# Patient Record
Sex: Male | Born: 1970 | Race: White | Hispanic: No | Marital: Married | Smoking: Never smoker
Health system: Southern US, Community
[De-identification: ages and names within clinical notes are randomized; demographics above are authoritative.]

---

## 2006-10-08 ENCOUNTER — Ambulatory Visit: Payer: Self-pay | Admitting: Internal Medicine

## 2006-10-17 ENCOUNTER — Ambulatory Visit: Payer: Self-pay | Admitting: Internal Medicine

## 2006-10-17 LAB — CONVERTED CEMR LAB
AST: 24 units/L (ref 0–37)
Bacteria, UA: NEGATIVE
Basophils Absolute: 0 10*3/uL (ref 0.0–0.1)
Bilirubin Urine: NEGATIVE
Bilirubin, Direct: 0.2 mg/dL (ref 0.0–0.3)
Chloride: 105 meq/L (ref 96–112)
Creatinine, Ser: 1.1 mg/dL (ref 0.4–1.5)
Direct LDL: 110.1 mg/dL
Eosinophils Absolute: 0.3 10*3/uL (ref 0.0–0.6)
GFR calc non Af Amer: 81 mL/min
Glucose, Bld: 102 mg/dL — ABNORMAL HIGH (ref 70–99)
HDL: 26.8 mg/dL — ABNORMAL LOW (ref >39.0)
Leukocytes, UA: NEGATIVE
Monocytes Absolute: 0.6 10*3/uL (ref 0.2–0.7)
Neutro Abs: 2.7 10*3/uL (ref 1.4–7.7)
Neutrophils Relative %: 49.4 % (ref 43.0–77.0)
Nitrite: NEGATIVE
Platelets: 264 10*3/uL (ref 150–400)
Sodium: 141 meq/L (ref 135–145)
Total Bilirubin: 1 mg/dL (ref 0.3–1.2)
Total CHOL/HDL Ratio: 6.9
Total Protein, Urine: NEGATIVE mg/dL
Triglycerides: 256 mg/dL (ref 0–149)
Urine Glucose: NEGATIVE mg/dL
Urobilinogen, UA: 0.2 (ref 0.0–1.0)
VLDL: 51 mg/dL — ABNORMAL HIGH (ref 0–40)
WBC: 5.6 10*3/uL (ref 4.5–10.5)
pH: 5.5 (ref 5.0–8.0)

## 2010-07-18 ENCOUNTER — Encounter: Payer: Self-pay | Admitting: Internal Medicine

## 2010-07-18 ENCOUNTER — Ambulatory Visit (INDEPENDENT_AMBULATORY_CARE_PROVIDER_SITE_OTHER): Payer: BC Managed Care – PPO | Admitting: Internal Medicine

## 2010-07-18 DIAGNOSIS — R110 Nausea: Secondary | ICD-10-CM | POA: Insufficient documentation

## 2010-07-18 DIAGNOSIS — Z23 Encounter for immunization: Secondary | ICD-10-CM

## 2010-07-20 ENCOUNTER — Encounter: Payer: Self-pay | Admitting: Internal Medicine

## 2010-07-20 LAB — CONVERTED CEMR LAB
ALT: 54 units/L — ABNORMAL HIGH (ref 0–53)
Alkaline Phosphatase: 49 units/L (ref 39–117)
BUN: 14 mg/dL (ref 6–23)
Cholesterol: 127 mg/dL (ref 0–200)
Creatinine, Ser: 1.26 mg/dL (ref 0.40–1.50)
Indirect Bilirubin: 0.5 mg/dL (ref 0.0–0.9)
Total Protein: 7 g/dL (ref 6.0–8.3)
Triglycerides: 99 mg/dL (ref <150)

## 2010-07-21 ENCOUNTER — Encounter: Payer: Self-pay | Admitting: Internal Medicine

## 2010-07-27 NOTE — Letter (Addendum)
   [place] [facility] [address] [place], [place]  [address]  [place] Phone: [phone]      July 21, 2010   Paul Blackwell [address] [place], [place] [address]  RE:  LAB RESULTS  Dear  [name],  The following is an interpretation of your most recent lab tests.  Please take note of any instructions provided or changes to medications that have resulted from your lab work.  ELECTROLYTES:  Good - no changes needed  KIDNEY FUNCTION TESTS:  Good - no changes needed  LIVER FUNCTION TESTS:  Abnormal - schedule a follow-up appointment  LIPID PANEL:  Fair - review at your next visit Triglyceride: 99   Cholesterol: 127   LDL: 81   HDL: 26   Chol/HDL%:  4.9 Ratio   One of your liver enzymes (ALT) is mildly elevated. This is usually due to taking over-the-counter anti-inflammatories or herbal supplements.   I suggest you return in 3 months for repeat blood test.  In addition, your good cholesterol HDL is too low.  Please try to avoid trans-fats and  exercise on regular basis.   Occasional intake of red wine can also help increase HDL.          Sincerely Yours,    Dr. Thomos Lemons  Appended Document:  mailed

## 2010-08-08 NOTE — Assessment & Plan Note (Signed)
Summary: to re est/bcbs/nausea/ss   Vital Signs:  Patient profile:   40 year old male Height:      70.5 inches Weight:      189 pounds BMI:     26.83 O2 Sat:      98 % on Room air Temp:     98.3 degrees F oral Pulse rate:   70 / minute BP sitting:   110 / 64  (right arm) Cuff size:   large  Vitals Entered By: Glendell Docker CMA (July 18, 2010 1:26 PM)  O2 Flow:  Room air CC: Re-establish care Is Patient Diabetic? No Pain Assessment Patient in pain? no      Comments mild nausea for the past week, with light colored stools, evaluation of midback pain   Primary Care Provider:  Dondra Spry DO  CC:  Re-establish care.  History of Present Illness: 40 y/o male to establish c/o of nausea last week scale of 1-5, he describes 3 didn't stop him from eating stools became light in color --  "peanut butter color", and loose  mother has hx of gallbladder dz  no pain  no unusual food intake,  no correlation with what he ate  occ motrin no regular NSAID use  he takes fish oil and Magnesium supplement,  herbal supplement "zyflomend",  MVI started supplements 1 yr ago  wife is feeling normal  no sick contacts   Preventive Screening-Counseling & Management  Alcohol-Tobacco     Alcohol drinks/day: 0     Smoking Status: never  Caffeine-Diet-Exercise     Caffeine use/day: 1 beverage daily     Does Patient Exercise: yes     Times/week: 4      Drug Use:  no.    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  2008  Past Surgical History: Denies surgical history  Family History: lung cancer grandfather parents healthy  no colon ca no prosate ca mat uncle SCC   Social History: Occupation:  Art gallery manager for [employer] Married no children Never Smoked Alcohol use-no Drug use-no Smoking Status:  never Caffeine use/day:  1 beverage daily Does  Patient Exercise:  yes Drug Use:  no  Review of Systems  The patient denies fever, weight loss, chest pain, dyspnea on exertion, abdominal pain, melena, hematochezia, severe indigestion/heartburn, and depression.    Physical Exam  General:  alert, well-developed, and well-nourished.   Head:  normocephalic and atraumatic.   Ears:  R ear normal and L ear normal.   Mouth:  pharynx pink and moist.   Neck:  supple and no masses.   Lungs:  normal respiratory effort, normal breath sounds, no crackles, and no wheezes.   Heart:  normal rate, regular rhythm, no murmur, and no gallop.   Abdomen:  soft, non-tender, normal bowel sounds, no masses, no hepatomegaly, and no splenomegaly.   Extremities:  No lower extremity edema  Neurologic:  cranial nerves II-XII intact and gait normal.     Impression & Recommendations:  Problem # 1:  NAUSEA (ICD-787.02) I suspect pt recovering from transient gastroenteritis check LFTs if persistent symptoms, consider abd u/s  Orders: T-Basic Metabolic Panel [phone]) T-Hepatic Function [phone])  Other Orders: T-Lipid Profile [phone]) Tdap => 14yrs IM (63875) Admin 1st Vaccine (64332) Future Orders: T-Hepatic Function [phone]) ... 10/02/2010  Patient Instructions: 1)  Please schedule a follow-up appointment as needed. 2)  Please schedule a follow-up appointment in 1 year. 3)  Call our office if your symptoms do not  improve or gets worse.   Orders Added: 1)  T-Basic Metabolic Panel [[id number]] 2)  T-Hepatic Function [[id number]] 3)  T-Lipid Profile [[id number]] 4)  Tdap => 73yrs IM [90715] 5)  Admin 1st Vaccine [90471] 6)  T-Hepatic Function [[id number]] 7)  New Patient Level III [78295]   Immunization History:  Influenza Immunization History:    Influenza:  historical (03/07/2010)  Immunizations Administered:  Tetanus Vaccine:    Vaccine Type: Tdap    Site: left deltoid    Mfr: GlaxoSmithKline    Dose: 0.5  ml    Route: IM    Given by: Glendell Docker CMA    Exp. Date: 03/09/2012    Lot #: [id number]    VIS given: 04/07/08 version given July 18, 2010.   Immunization History:  Influenza Immunization History:    Influenza:  Historical (03/07/2010)  Immunizations Administered:  Tetanus Vaccine:    Vaccine Type: Tdap    Site: left deltoid    Mfr: GlaxoSmithKline    Dose: 0.5 ml    Route: IM    Given by: Glendell Docker CMA    Exp. Date: 03/09/2012    Lot #: [id number]    VIS given: 04/07/08 version given July 18, 2010.  Current Allergies (reviewed today): No known allergies

## 2010-10-03 NOTE — Assessment & Plan Note (Signed)
[facility]                           PRIMARY CARE OFFICE NOTE   NAME:Paul, Blackwell                         MRN:          [id number]  DATE:10/08/2006                            DOB:          07-31-1970    CHIEF COMPLAINT:  New patient to practice/chronic low back/thoracic  pain.   HISTORY OF PRESENT ILLNESS:  The patient is a 40 year old white male,  here to establish primary care.  He complains of chronic low back  pain/upper thoracic pain for the last 10 years that is intermittent.  He  has been to a chiropractor in the past, with minimal relief.  He  describes an aching type sensation that is exacerbation with extension  of his thoracolumbar spine.  No radiation to his legs.  No lower  extremity weakness.  He actually has some improvement when he engages in  physical activity.  [place] and [place] several  horses on a farm.  He notes physical labor occasionally improves the  discomfort.   He has not had any imaging studies.  There is no family history of back  problems.  He reports intermittent radiation of pain toward his sacral  area bilaterally.   CURRENT MEDICATIONS:  None.  Occasional OTC Motrin/ibuprofen.   ALLERGIES:  No known drug allergies.   SOCIAL HISTORY:  The patient is married for the last seven years.  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.   FAMILY HISTORY:  Mother and father are both age 60, healthy.  Emelia Loron is known to have lung cancer.   HABITS:  No alcohol, no tobacco, no recreational drug use.   REVIEW OF SYSTEMS:  Occasional hay fever/allergies.  No chest pain,  shortness of breath.  No heartburn, nausea, vomiting, constipation or  diarrhea.  No unusual rash.  No weight loss.  All other systems  negative.   PHYSICAL EXAMINATION:  VITAL SIGNS:  Weight 196.6 pounds, temperature  97.4 degrees, pulse 58, blood pressure 127/81 in the left arm in the  seated  position.  GENERAL:  The patient is a pleasant, well-developed and well-nourished  40 year old white male, in no apparent distress.  HEENT:  Normocephalic and atraumatic.  Pupils equal, reactive to light  bilaterally.  Extraocular motility intact.  The patient was anicteric.  Conjunctivae within normal limits.  Auditory canals and tympanic  membranes were clear bilaterally.  Hearing grossly normal.  NECK:  Supple, no adenopathy, carotid bruits or thyromegaly.  CHEST:  Normal respiratory effort.  Chest clear to auscultation  bilaterally.  No rales, rhonchi or wheezing.  CARDIOVASCULAR:  A regular rate and rhythm.  No significant murmurs,  rubs or gallops appreciated.  ABDOMEN:  Soft, nontender.  Positive bowel sounds.  No organomegaly.  MUSCULOSKELETAL:  The patient had 5/5 muscle strength throughout.  He  had a normal range of motion to his lumbar spine.  No pain with leg  raise bilaterally.  Reflexes were 0-1 in the upper extremity and +2 to 3  patella and +2 Achilles.  The patient was able to toe and  heel walk  without any difficulty.   IMPRESSION/RECOMMENDATIONS:  1. Chronic intermittent low back, otherwise healthy 40 year old white      male.  2. Health maintenance.   RECOMMENDATIONS:  1. The patient has uncomplicated low back pain.  I doubt that he has      any sacroiliac disease.  I doubt that he has ankylosing spondylitis      but will obtain an x-ray of his thoracolumbar spine.  I recommended      periodic over-the-counter analgesics and provided educational      material on low back exercises.  2. For routine health maintenance he will obtain screening labs,      including blood sugar and cholesterol, and arrange followup within      two to four weeks.     Barbette Hair. Artist Pais, DO  Electronically Signed    RDY/MedQ  DD: 10/09/2006  DT: 10/09/2006  Job #: [id number]

## 2013-01-14 ENCOUNTER — Ambulatory Visit (INDEPENDENT_AMBULATORY_CARE_PROVIDER_SITE_OTHER): Payer: BC Managed Care – PPO | Admitting: Psychology

## 2013-01-14 DIAGNOSIS — F411 Generalized anxiety disorder: Secondary | ICD-10-CM

## 2013-01-29 ENCOUNTER — Ambulatory Visit (HOSPITAL_COMMUNITY)
Admission: RE | Admit: 2013-01-29 | Discharge: 2013-01-29 | Disposition: A | Discharge status: Home / Self Care | Payer: BC Managed Care – PPO | Source: Ambulatory Visit | Attending: Chiropractic Medicine | Admitting: Chiropractic Medicine

## 2013-01-29 ENCOUNTER — Other Ambulatory Visit (HOSPITAL_COMMUNITY): Payer: Self-pay | Admitting: Chiropractic Medicine

## 2013-01-29 DIAGNOSIS — M549 Dorsalgia, unspecified: Secondary | ICD-10-CM

## 2013-01-29 DIAGNOSIS — M4130 Thoracogenic scoliosis, site unspecified: Secondary | ICD-10-CM | POA: Insufficient documentation

## 2013-02-02 ENCOUNTER — Ambulatory Visit (INDEPENDENT_AMBULATORY_CARE_PROVIDER_SITE_OTHER): Payer: BC Managed Care – PPO | Admitting: Psychology

## 2013-02-02 DIAGNOSIS — F411 Generalized anxiety disorder: Secondary | ICD-10-CM

## 2013-02-19 ENCOUNTER — Ambulatory Visit: Payer: BC Managed Care – PPO | Admitting: Psychology

## 2013-02-26 ENCOUNTER — Ambulatory Visit (INDEPENDENT_AMBULATORY_CARE_PROVIDER_SITE_OTHER): Payer: BC Managed Care – PPO | Admitting: Psychology

## 2013-02-26 DIAGNOSIS — F411 Generalized anxiety disorder: Secondary | ICD-10-CM

## 2013-03-05 ENCOUNTER — Ambulatory Visit: Payer: BC Managed Care – PPO | Admitting: Psychology

## 2013-03-11 ENCOUNTER — Ambulatory Visit: Payer: BC Managed Care – PPO | Admitting: Psychology

## 2013-03-25 ENCOUNTER — Ambulatory Visit: Payer: BC Managed Care – PPO | Admitting: Psychology

## 2013-04-01 ENCOUNTER — Ambulatory Visit (INDEPENDENT_AMBULATORY_CARE_PROVIDER_SITE_OTHER): Payer: BC Managed Care – PPO | Admitting: Psychology

## 2013-04-01 DIAGNOSIS — F411 Generalized anxiety disorder: Secondary | ICD-10-CM

## 2013-04-08 ENCOUNTER — Ambulatory Visit: Payer: BC Managed Care – PPO | Admitting: Psychology

## 2013-04-09 ENCOUNTER — Ambulatory Visit (INDEPENDENT_AMBULATORY_CARE_PROVIDER_SITE_OTHER): Payer: BC Managed Care – PPO | Admitting: Psychology

## 2013-04-09 DIAGNOSIS — F411 Generalized anxiety disorder: Secondary | ICD-10-CM

## 2013-04-15 ENCOUNTER — Ambulatory Visit (INDEPENDENT_AMBULATORY_CARE_PROVIDER_SITE_OTHER): Payer: BC Managed Care – PPO | Admitting: Psychology

## 2013-04-15 DIAGNOSIS — F411 Generalized anxiety disorder: Secondary | ICD-10-CM

## 2013-04-20 ENCOUNTER — Ambulatory Visit (INDEPENDENT_AMBULATORY_CARE_PROVIDER_SITE_OTHER): Payer: BC Managed Care – PPO | Admitting: Psychology

## 2013-04-20 DIAGNOSIS — F411 Generalized anxiety disorder: Secondary | ICD-10-CM

## 2013-04-30 ENCOUNTER — Ambulatory Visit: Payer: BC Managed Care – PPO | Admitting: Psychology

## 2013-05-06 ENCOUNTER — Ambulatory Visit (INDEPENDENT_AMBULATORY_CARE_PROVIDER_SITE_OTHER): Payer: BC Managed Care – PPO | Admitting: Psychology

## 2013-05-06 DIAGNOSIS — F411 Generalized anxiety disorder: Secondary | ICD-10-CM

## 2013-05-11 ENCOUNTER — Ambulatory Visit (INDEPENDENT_AMBULATORY_CARE_PROVIDER_SITE_OTHER): Payer: BC Managed Care – PPO | Admitting: Psychology

## 2013-05-11 DIAGNOSIS — F411 Generalized anxiety disorder: Secondary | ICD-10-CM

## 2013-05-18 ENCOUNTER — Ambulatory Visit: Payer: BC Managed Care – PPO | Admitting: Psychology

## 2014-08-19 ENCOUNTER — Ambulatory Visit (INDEPENDENT_AMBULATORY_CARE_PROVIDER_SITE_OTHER): Payer: BLUE CROSS/BLUE SHIELD | Admitting: Physician Assistant

## 2014-08-19 VITALS — BP 120/80 | HR 63 | Temp 98.4°F | Ht 70.0 in | Wt 186.5 lb

## 2014-08-19 DIAGNOSIS — F909 Attention-deficit hyperactivity disorder, unspecified type: Secondary | ICD-10-CM

## 2014-08-19 DIAGNOSIS — E291 Testicular hypofunction: Secondary | ICD-10-CM | POA: Insufficient documentation

## 2014-08-19 DIAGNOSIS — F988 Other specified behavioral and emotional disorders with onset usually occurring in childhood and adolescence: Secondary | ICD-10-CM

## 2014-08-19 DIAGNOSIS — E079 Disorder of thyroid, unspecified: Secondary | ICD-10-CM | POA: Insufficient documentation

## 2014-08-19 MED ORDER — AMPHETAMINE-DEXTROAMPHETAMINE 10 MG PO TABS: 10.0000 mg | ORAL_TABLET | Freq: Two times a day (BID) | ORAL | Status: DC

## 2014-08-19 NOTE — Patient Instructions (Signed)
You will titrate your medication starting with 1 pill in the am - you will try and notice how it helps you and for how long - if it is not strong enough you can increase your dose by 1/4 to 1/2 pill every 2 days until you get to 20mg  or you get relief and good focus.  If the time treated with 1 dose in the am if not enough to get you through your day you can take the same dose in the afternoon that you take in the am.  Please mychart message me with questions.  [facility] Policy for Prescribing Controlled Substances (Revised 03/2012) 1. Prescriptions for controlled substances will be filled by ONE provider at [facility] with whom you have established and developed a plan for your care, including follow-up. 2. You are encouraged to schedule an appointment with your prescriber at our appointment center for follow-up visits whenever possible. 3. If you request a prescription for the controlled substance while at [facility] for an acute problem (with someone other than your regular prescriber), you MAY be given a ONE-TIME prescription for a 30-day supply of the controlled substance, to allow time for you to return to see your regular prescriber for additional prescriptions.

## 2014-08-19 NOTE — Progress Notes (Signed)
Subjective:    Patient ID: Paul Blackwell, male    DOB: 07-05-1970, 44 y.o.   MRN: [id number]  HPI  Pt presents to clinic for evaluation for ADD medication initiation.  He has been seeing [name] and he has been diagnosed with ADD.  He thinks that he has always had problems but since seeing Rolm Gala he now realizes that there is help for him.  He has problems retaining conversations and multi-tasking - he has trouble starting a task and then having to do something else before that task is completed.  This has started to cause some marital discord.  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  His focused is reduced the more he has to finish.  He has never had trouble with his job but he thinks that his job performance could improve.  He never had problems in school.  He has no h/o cardiac problems.  Review of Systems  Constitutional: Negative for fever and chills.  Psychiatric/Behavioral: Positive for decreased concentration.   Patient Active Problem List   Diagnosis Date Noted  . Hypogonadism in male 08/19/2014  . Thyroid disorder 08/19/2014   Prior to Admission medications   Medication Sig Start Date End Date Taking? Authorizing Provider  NALTREXONE HCL PO Take by mouth daily.   Yes Historical Provider, MD  testosterone cypionate (DEPOTESTOTERONE CYPIONATE) 200 MG/ML injection Inject into the muscle once a week.   Yes Historical Provider, MD   No Known Allergies  Medications, allergies, past medical history, surgical history, family history, social history and problem list reviewed and updated.      Objective:   Physical Exam  Constitutional: He is oriented to person, place, and time. He appears well-developed and well-nourished.  BP 120/80 mmHg  Pulse 63  Temp(Src) 98.4 F (36.9 C) (Oral)  Ht  (1.778 m)  Wt 186 lb 8 oz (84.596 kg)  BMI 26.76 kg/m2  SpO2 97%   HENT:    Head: Normocephalic and atraumatic.  Right Ear: External ear normal.  Left Ear: External ear normal.  Cardiovascular: Normal rate, regular rhythm and normal heart sounds.   No murmur heard. Pulmonary/Chest: Effort normal.  Neurological: He is alert and oriented to person, place, and time. He has normal reflexes.  Skin: Skin is warm and dry.  Psychiatric: He has a normal mood and affect. His behavior is normal. Judgment and thought content normal.       Assessment & Plan:  ADD (attention deficit disorder) - Plan: amphetamine-dextroamphetamine (ADDERALL) 10 MG tablet   We discussed how to take the mediation and what to expect and how to titrate to certain dose or response.  He will contact me through [facility] of his progress and he will make an appt to see me mid May unless he needs me before then.    Patient Instructions  You will titrate your medication starting with 1 pill in the am - you will try and notice how it helps you and for how long - if it is not strong enough you can increase your dose by 1/4 to 1/2 pill every 2 days until you get to  or you get relief and good focus.  If the time treated with 1 dose in the am if not enough to get you through your day you can take the same dose in the afternoon that you take  in the am.  Please mychart message me with questions.  [facility] Policy for Prescribing Controlled Substances (Revised 03/2012) 1. Prescriptions for controlled substances will be filled by ONE provider at [facility] with whom you have established and developed a plan for your care, including follow-up. 2. You are encouraged to schedule an appointment with your prescriber at our appointment center for follow-up visits whenever possible. 3. If you request a prescription for the controlled substance while at [facility] for an acute problem (with someone other than your regular prescriber), you MAY be given a ONE-TIME prescription for a 30-day supply of the controlled substance, to allow time  for you to return to see your regular prescriber for additional prescriptions.     Benny LennertSarah Beckett Maden PA-C  [facility] 08/19/2014 10:38 PM

## 2014-09-07 ENCOUNTER — Telehealth: Payer: Self-pay | Admitting: Physician Assistant

## 2014-09-07 DIAGNOSIS — F988 Other specified behavioral and emotional disorders with onset usually occurring in childhood and adolescence: Secondary | ICD-10-CM

## 2014-09-07 MED ORDER — AMPHETAMINE-DEXTROAMPHET ER 5 MG PO CP24: 5.0000 mg | ORAL_CAPSULE | Freq: Every day | ORAL | Status: DC

## 2014-09-07 NOTE — Telephone Encounter (Signed)
Placed up front for patient to pick up.  He knows about it being ready.

## 2014-09-07 NOTE — Telephone Encounter (Signed)
Got [facility]  He is getting some benefit from the Adderall but he is having a lot of jittery feelings and roller coaster like symptoms.  The Adderall 10mg  was to much medication and then 5mg  was not enough to give him benefit.  He tried the 7.5 mg and it was causing side effects.  It did allow him to be more efficient at work and shift his focus better but the side effects were to much.  As a result we will try the Adderall XR 5mg  1 -2 po qam and see if that gives him the benefit without the side effects. If he is still having the side effects we might change to focalin or ritalin.

## 2014-09-15 ENCOUNTER — Telehealth: Payer: Self-pay | Admitting: Physician Assistant

## 2014-09-15 DIAGNOSIS — F988 Other specified behavioral and emotional disorders with onset usually occurring in childhood and adolescence: Secondary | ICD-10-CM

## 2014-09-15 NOTE — Telephone Encounter (Signed)
Rolm GalaErik from [facility] -- Pt does not want to pay the cost of Adderall XR and he would like to try something else.  We are going to switch to Ritalin LA 10mg  1-2 po qd.  LMOM to return call.

## 2014-09-16 NOTE — Telephone Encounter (Signed)
Patient returning a call to Benny LennertSarah Weber PA. Patient can be reached at [phone]

## 2014-09-17 MED ORDER — DEXMETHYLPHENIDATE HCL ER 5 MG PO CP24: 5.0000 mg | ORAL_CAPSULE | Freq: Every day | ORAL | Status: DC

## 2014-09-17 NOTE — Telephone Encounter (Signed)
Pt is more interested in Focalin XR than Ritalin so he would like to try that 1st.  He will start for the first day or so with 1 pill to make sure he is not having any side effects and then he can increase to 10mg  if he is tolerating well and needs more control.  He will take that dose for at least a week to see his results.  If he has bad side effects he will return to the Adderall 5mg  which helps slightly with his ADD but anything higher than Adderall 5mg  gave him side effects that he could not tolerate.

## 2014-09-30 ENCOUNTER — Telehealth: Payer: Self-pay | Admitting: Physician Assistant

## 2014-09-30 NOTE — Telephone Encounter (Signed)
Pt has gotten to Focalin XR 10mg  and it seems to be helping some but not as much as the Adderall but he is having no side effects on this medication.  He was instructed to increase to Focalin XR 15mg  daily.

## 2014-10-08 ENCOUNTER — Telehealth: Payer: Self-pay | Admitting: Physician Assistant

## 2014-10-08 MED ORDER — METHYLPHENIDATE HCL ER (LA) 10 MG PO CP24: 10.0000 mg | ORAL_CAPSULE | Freq: Every day | ORAL | Status: DC

## 2014-10-08 NOTE — Telephone Encounter (Signed)
Heard from Franchot ErichsenErik Blackwell - pt did not get a good response to Focalin and when he went up on the dose he got groggy.  We will try ritalin LA next to see if we can get a response without the SE.

## 2014-10-15 ENCOUNTER — Telehealth: Payer: Self-pay

## 2014-10-15 NOTE — Telephone Encounter (Signed)
PA needed for Ritalin LA (methylphenidate ER is preferred because it comes in a generic). Called and completed a prior auth on medication which was approved, but it also needs a quantity override. I provided info that pt tried/failed Adderall IR and XR, and Focalin, and that provider is titrating the dose to determine best dose for pt and then will order the needed strength so that pt will only have to take 1 per day.  PA was approved for the medication itself #30 for 30 days through 10/14/16, case # [id number]. Quantity override so pt can take 2 per day is only approved for titration purposes for 2 mos through 12/15/14, case # [id number]. Pharmacy notified.

## 2014-10-21 ENCOUNTER — Telehealth: Payer: Self-pay | Admitting: Physician Assistant

## 2014-10-21 DIAGNOSIS — F988 Other specified behavioral and emotional disorders with onset usually occurring in childhood and adolescence: Secondary | ICD-10-CM

## 2014-10-21 MED ORDER — METHYLPHENIDATE HCL 10 MG PO TABS: 10.0000 mg | ORAL_TABLET | Freq: Two times a day (BID) | ORAL | Status: DC

## 2014-10-21 NOTE — Telephone Encounter (Signed)
Got a call from Paul ErichsenErik Nelson pt cannot afford Ritalin LA.  We will try Ritalin 10mg  1-2 bid

## 2015-02-24 ENCOUNTER — Telehealth: Payer: Self-pay

## 2015-02-24 NOTE — Telephone Encounter (Signed)
PT PLANS TO SEE SARAH WEBER ASAP BUT WOULD LIKE A RITALIN RX TO HOLD HIM OVER UNTIL THEN.

## 2015-02-27 NOTE — Telephone Encounter (Signed)
I just want to clarify which Ritalin he was using last?  He has been on both in the past but I think that he was last on the Ritalin bid dosing.  I will write him a 1 month Rx and then he will need an OV.

## 2015-02-28 ENCOUNTER — Ambulatory Visit (INDEPENDENT_AMBULATORY_CARE_PROVIDER_SITE_OTHER): Payer: BLUE CROSS/BLUE SHIELD | Admitting: Physician Assistant

## 2015-02-28 VITALS — BP 124/84 | HR 67 | Temp 98.2°F | Resp 18 | Ht 70.0 in | Wt 190.4 lb

## 2015-02-28 DIAGNOSIS — F909 Attention-deficit hyperactivity disorder, unspecified type: Secondary | ICD-10-CM

## 2015-02-28 DIAGNOSIS — F988 Other specified behavioral and emotional disorders with onset usually occurring in childhood and adolescence: Secondary | ICD-10-CM

## 2015-02-28 MED ORDER — METHYLPHENIDATE HCL 10 MG PO TABS: 10.0000 mg | ORAL_TABLET | Freq: Two times a day (BID) | ORAL | Status: DC

## 2015-02-28 NOTE — Progress Notes (Signed)
   Paul Blackwell  MRN: [id number] DOB: 16-Oct-1970  Subjective:  Pt presents to clinic for a medication refill.  He feels like the Ritalin is what works best for him.  He gets relief from his symptoms but it does not keep him up all night.  He has been taking  bid (the latest dose about 3 because any later and he has trouble sleeping).  He has tried  bid but he feels like that is to much but he is not sure that  is quite enough all the time.  He feels like 7.5mg  would be the best dose but is unsure how to get to that dose.  He is overall happy with the results and he would like to continue.  Adderall is not effective Ritalin LA - wore off about 3 pm but kept him up all night Ritalin bid - working well -   Patient Active Problem List   Diagnosis Date Noted  . ADD (attention deficit disorder) 09/07/2014  . Hypogonadism in male 08/19/2014  . Thyroid disorder 08/19/2014    Current Outpatient Prescriptions on File Prior to Visit  Medication Sig Dispense Refill  . NALTREXONE HCL PO Take by mouth daily.    Marland Kitchen testosterone cypionate (DEPOTESTOTERONE CYPIONATE) 200 MG/ML injection Inject into the muscle once a week.     No current facility-administered medications on file prior to visit.    No Known Allergies  Review of Systems  Psychiatric/Behavioral: Positive for decreased concentration (improved on medications).   Objective:  BP 124/84 mmHg  Pulse 67  Temp(Src) 98.2 F (36.8 C) (Oral)  Resp 18  Ht  (1.778 m)  Wt 190 lb 6 oz (86.354 kg)  BMI 27.32 kg/m2  SpO2 98%  Physical Exam  Constitutional: He is oriented to person, place, and time and well-developed, well-nourished, and in no distress.  HENT:  Head: Normocephalic and atraumatic.  Right Ear: External ear normal.  Left Ear: External ear normal.  Eyes: Conjunctivae are normal.  Neck: Normal range of motion.  Pulmonary/Chest: Effort normal.  Neurological: He is alert and oriented to person, place, and time.  Gait normal.  Skin: Skin is warm and dry.  Psychiatric: Mood, memory, affect and judgment normal.    Assessment and Plan :  ADD (attention deficit disorder) - Plan: methylphenidate (RITALIN) 10 MG tablet - he will continue Ritalin 5-10 mg bid.  He may try to get to a close 7.5mg  tablet by breaking the scored pill - he will let me know when he needs a refill and he will f/u with me in 6 months or sooner if he has problems.  Benny Lennert PA-C  [facility] 02/28/2015 8:16 PM

## 2015-02-28 NOTE — Telephone Encounter (Signed)
Left message for pt to call back. To verify dose of Ritalin.

## 2015-03-03 NOTE — Telephone Encounter (Signed)
Maralyn SagoSarah has already written this.

## 2015-04-07 ENCOUNTER — Telehealth: Payer: Self-pay

## 2015-04-07 NOTE — Telephone Encounter (Signed)
Franchot Erichsenrik Nelson wants to know if you could give him a call about this pt [phone]

## 2015-04-08 MED ORDER — METHYLPHENIDATE HCL ER (OSM) 18 MG PO TBCR
18.0000 mg | EXTENDED_RELEASE_TABLET | Freq: Every day | ORAL | Status: DC
Start: 2015-04-08 — End: 2015-05-17

## 2015-04-08 NOTE — Telephone Encounter (Signed)
Pt.notified

## 2015-04-08 NOTE — Telephone Encounter (Signed)
Pt does not think that the Ritalin is working anymore - he tried to increase the dose but still no help.  We will try Concerta.  Rx is upfront and patient knows about it.

## 2015-05-16 ENCOUNTER — Telehealth: Payer: Self-pay

## 2015-05-16 NOTE — Telephone Encounter (Signed)
Pt in need of his CONCERTA 18 MG PO CR. Please call [phone] when ready for pick up

## 2015-05-17 MED ORDER — METHYLPHENIDATE HCL ER (OSM) 18 MG PO TBCR: 18.0000 mg | EXTENDED_RELEASE_TABLET | Freq: Every day | ORAL | Status: DC

## 2015-05-17 NOTE — Telephone Encounter (Signed)
Rx printed at 104. Will bring to 102 after clinic.  Meds ordered this encounter  Medications  . methylphenidate (CONCERTA) 18 MG PO CR tablet    Sig: Take 1 tablet (18 mg total) by mouth daily.    Dispense:  30 tablet    Refill:  0    Order Specific Question:  Supervising Provider    Answer:  DOOLITTLE, ROBERT P [[id number]]

## 2015-05-17 NOTE — Telephone Encounter (Signed)
Rx in drawer at 102. Notified pt ready.

## 2015-06-14 ENCOUNTER — Telehealth: Payer: Self-pay

## 2015-06-14 MED ORDER — METHYLPHENIDATE HCL ER (OSM) 18 MG PO TBCR: 18.0000 mg | EXTENDED_RELEASE_TABLET | Freq: Every day | ORAL | Status: DC

## 2015-06-14 NOTE — Telephone Encounter (Signed)
Appt 09/01/2015

## 2015-06-14 NOTE — Telephone Encounter (Signed)
Done

## 2015-06-14 NOTE — Telephone Encounter (Signed)
Pt is needing to get a refill on concerta   Best number is [phone]

## 2015-06-15 NOTE — Telephone Encounter (Signed)
Notified pt ready. 

## 2015-07-07 ENCOUNTER — Other Ambulatory Visit: Payer: Self-pay

## 2015-07-07 NOTE — Telephone Encounter (Signed)
Pt is needing a refill on concerta  Best number [phone]

## 2015-07-12 MED ORDER — METHYLPHENIDATE HCL ER (OSM) 18 MG PO TBCR: 18.0000 mg | EXTENDED_RELEASE_TABLET | Freq: Every day | ORAL | Status: DC

## 2015-07-12 NOTE — Telephone Encounter (Signed)
Ready for pick-up  Paul Blackwell

## 2015-07-12 NOTE — Telephone Encounter (Signed)
Notified pt ready on VM. 

## 2015-08-15 ENCOUNTER — Telehealth: Payer: Self-pay

## 2015-08-15 NOTE — Telephone Encounter (Signed)
Pt requesting refill on CONCERTA 18 MG. Please call [phone] when ready for pick up

## 2015-08-16 NOTE — Telephone Encounter (Signed)
Appt on 09/01/15 with Paul Blackwell

## 2015-08-19 MED ORDER — METHYLPHENIDATE HCL ER (OSM) 18 MG PO TBCR: 18.0000 mg | EXTENDED_RELEASE_TABLET | Freq: Every day | ORAL | Status: DC

## 2015-08-19 NOTE — Telephone Encounter (Signed)
Done

## 2015-08-31 ENCOUNTER — Ambulatory Visit: Payer: BLUE CROSS/BLUE SHIELD | Admitting: Physician Assistant

## 2015-09-01 ENCOUNTER — Ambulatory Visit (INDEPENDENT_AMBULATORY_CARE_PROVIDER_SITE_OTHER): Payer: BLUE CROSS/BLUE SHIELD | Admitting: Physician Assistant

## 2015-09-01 ENCOUNTER — Encounter: Payer: Self-pay | Admitting: Physician Assistant

## 2015-09-01 VITALS — BP 126/70 | HR 80 | Temp 98.7°F | Resp 16 | Ht 70.25 in | Wt 183.2 lb

## 2015-09-01 DIAGNOSIS — F909 Attention-deficit hyperactivity disorder, unspecified type: Secondary | ICD-10-CM

## 2015-09-01 DIAGNOSIS — F988 Other specified behavioral and emotional disorders with onset usually occurring in childhood and adolescence: Secondary | ICD-10-CM

## 2015-09-01 MED ORDER — METHYLPHENIDATE HCL ER (OSM) 18 MG PO TBCR: 18.0000 mg | EXTENDED_RELEASE_TABLET | Freq: Every day | ORAL | Status: DC

## 2015-09-01 NOTE — Progress Notes (Signed)
   Paul Blackwell  MRN: [id number] DOB: 01/16/1971  Subjective:  Pt presents to clinic  Patient Active Problem List   Diagnosis Date Noted  . ADD (attention deficit disorder) 09/07/2014  . Hypogonadism in male 08/19/2014  . Thyroid disorder 08/19/2014    Current Outpatient Prescriptions on File Prior to Visit  Medication Sig Dispense Refill  . methylphenidate (CONCERTA) 18 MG PO CR tablet Take 1 tablet (18 mg total) by mouth daily. 30 tablet 0  . NALTREXONE HCL PO Take by mouth daily.    . testosterone cypionate (DEPOTESTOTERONE CYPIONATE) 200 MG/ML injection Inject into the muscle once a week.     No current facility-administered medications on file prior to visit.    No Known Allergies  Review of Systems Objective:  BP 126/70 mmHg  Pulse 80  Temp(Src) 98.7 F (37.1 C) (Oral)  Resp 16  Ht 5' 10.25" (1.784 m)  Wt 183 lb 3.2 oz (83.099 kg)  BMI 26.11 kg/m2  SpO2 98%  Physical Exam  Assessment and Plan :  No diagnosis found.  Jameison Haji PA-C  [facility] 09/01/2015 1:56 PM  

## 2015-09-01 NOTE — Progress Notes (Signed)
   Paul SpragueJoshua J Hatchel  MRN: [id number] DOB: 06/22/1970  Subjective:  Pt presents to clinic  Patient Active Problem List   Diagnosis Date Noted  . ADD (attention deficit disorder) 09/07/2014  . Hypogonadism in male 08/19/2014  . Thyroid disorder 08/19/2014    Current Outpatient Prescriptions on File Prior to Visit  Medication Sig Dispense Refill  . methylphenidate (CONCERTA) 18 MG PO CR tablet Take 1 tablet (18 mg total) by mouth daily. 30 tablet 0  . NALTREXONE HCL PO Take by mouth daily.    Marland Kitchen. testosterone cypionate (DEPOTESTOTERONE CYPIONATE) 200 MG/ML injection Inject into the muscle once a week.     No current facility-administered medications on file prior to visit.    No Known Allergies  Review of Systems Objective:  BP 126/70 mmHg  Pulse 80  Temp(Src) 98.7 F (37.1 C) (Oral)  Resp 16  Ht 5' 10.25" (1.784 m)  Wt 183 lb 3.2 oz (83.099 kg)  BMI 26.11 kg/m2  SpO2 98%  Physical Exam  Assessment and Plan :  No diagnosis found.  Benny LennertSarah Jahzier Villalon PA-C  [facility] 09/01/2015 1:56 PM

## 2015-09-01 NOTE — Patient Instructions (Signed)
     IF you received an x-ray today, you will receive an invoice from [facility]. Please contact  Radiology at [phone] with questions or concerns regarding your invoice.   IF you received labwork today, you will receive an invoice from [facility]. Please contact [facility] at [phone] with questions or concerns regarding your invoice.   Our billing staff will not be able to assist you with questions regarding bills from these companies.  You will be contacted with the lab results as soon as they are available. The fastest way to get your results is to activate your My Chart account. Instructions are located on the last page of this paperwork. If you have not heard from us regarding the results in 2 weeks, please contact this office.      

## 2015-09-01 NOTE — Progress Notes (Signed)
   Paul Blackwell  MRN: [id number] DOB: 05/11/1971  Subjective:  Pt presents to clinic for a recheck.  He has been doing really good on the Concerta but he has to pay out of pocket for the medication.  He mainly takes it at work but will sometimes take it on the weekends.  He finds that it really helps his attention and he gets no side effects from it.  Patient Active Problem List   Diagnosis Date Noted  . ADD (attention deficit disorder) 09/07/2014  . Hypogonadism in male 08/19/2014  . Thyroid disorder 08/19/2014    Current Outpatient Prescriptions on File Prior to Visit  Medication Sig Dispense Refill  . NALTREXONE HCL PO Take by mouth daily.    Marland Kitchen. testosterone cypionate (DEPOTESTOTERONE CYPIONATE) 200 MG/ML injection Inject into the muscle once a week.     No current facility-administered medications on file prior to visit.    No Known Allergies  Review of Systems Objective:  BP 126/70 mmHg  Pulse 80  Temp(Src) 98.7 F (37.1 C) (Oral)  Resp 16  Ht 5' 10.25" (1.784 m)  Wt 183 lb 3.2 oz (83.099 kg)  BMI 26.11 kg/m2  SpO2 98%  Physical Exam  Constitutional: He is oriented to person, place, and time and well-developed, well-nourished, and in no distress.  HENT:  Head: Normocephalic and atraumatic.  Right Ear: External ear normal.  Left Ear: External ear normal.  Eyes: Conjunctivae are normal.  Neck: Normal range of motion.  Pulmonary/Chest: Effort normal.  Neurological: He is alert and oriented to person, place, and time. Gait normal.  Skin: Skin is warm and dry.  Psychiatric: Mood, memory, affect and judgment normal.    Assessment and Plan :  ADD (attention deficit disorder) - Plan: methylphenidate (CONCERTA) 18 MG PO CR tablet continue medications - recheck in 6 months - pt will try to contact the insurance to see if the medication will be covered since he failed so many other medications for his ADD due to side effects.  Benny LennertSarah Weber PA-C  [facility] 09/01/2015 2:34 PM

## 2015-11-09 ENCOUNTER — Other Ambulatory Visit: Payer: Self-pay

## 2015-11-09 DIAGNOSIS — F988 Other specified behavioral and emotional disorders with onset usually occurring in childhood and adolescence: Secondary | ICD-10-CM

## 2015-11-09 MED ORDER — METHYLPHENIDATE HCL ER (OSM) 18 MG PO TBCR: 18.0000 mg | EXTENDED_RELEASE_TABLET | Freq: Every day | ORAL | Status: DC

## 2015-11-09 NOTE — Telephone Encounter (Signed)
Done - ready up front

## 2015-11-09 NOTE — Telephone Encounter (Signed)
Patient is calling to request a refill for concerta. Patient states he's out and tried to call Monday and he is out of his medication. [phone]

## 2015-11-09 NOTE — Telephone Encounter (Signed)
Notified pt ready. 

## 2015-11-25 DIAGNOSIS — E063 Autoimmune thyroiditis: Secondary | ICD-10-CM | POA: Diagnosis not present

## 2015-11-25 DIAGNOSIS — R7989 Other specified abnormal findings of blood chemistry: Secondary | ICD-10-CM | POA: Diagnosis not present

## 2015-11-25 DIAGNOSIS — E559 Vitamin D deficiency, unspecified: Secondary | ICD-10-CM | POA: Diagnosis not present

## 2015-11-25 DIAGNOSIS — R5383 Other fatigue: Secondary | ICD-10-CM | POA: Diagnosis not present

## 2015-12-06 ENCOUNTER — Telehealth: Payer: Self-pay

## 2015-12-06 DIAGNOSIS — F988 Other specified behavioral and emotional disorders with onset usually occurring in childhood and adolescence: Secondary | ICD-10-CM

## 2015-12-06 NOTE — Telephone Encounter (Signed)
REFILL REQUEST   WEBER   methylphenidate (CONCERTA) 18 MG PO CR tablet   Please call patient when ready for pick up.  [phone]

## 2015-12-14 MED ORDER — METHYLPHENIDATE HCL ER (OSM) 18 MG PO TBCR: 18.0000 mg | EXTENDED_RELEASE_TABLET | Freq: Every day | ORAL | 0 refills | Status: DC

## 2015-12-14 NOTE — Telephone Encounter (Signed)
Given to patient  

## 2016-01-11 ENCOUNTER — Other Ambulatory Visit: Payer: Self-pay

## 2016-01-11 DIAGNOSIS — F988 Other specified behavioral and emotional disorders with onset usually occurring in childhood and adolescence: Secondary | ICD-10-CM

## 2016-01-11 NOTE — Telephone Encounter (Signed)
Patient wants to know if Paul Blackwell will be willing to refill  concerta for 27 MG. Please advise!   [phone]

## 2016-01-14 MED ORDER — METHYLPHENIDATE HCL ER (OSM) 18 MG PO TBCR: 18.0000 mg | EXTENDED_RELEASE_TABLET | Freq: Every day | ORAL | 0 refills | Status: DC

## 2016-01-14 NOTE — Telephone Encounter (Signed)
Done

## 2016-01-16 NOTE — Telephone Encounter (Signed)
Notified pt ready and clarified that he needs f/up by mid Oct. Pt was aware of our new same day appts.

## 2016-02-08 DIAGNOSIS — Z23 Encounter for immunization: Secondary | ICD-10-CM | POA: Diagnosis not present

## 2016-02-16 ENCOUNTER — Ambulatory Visit (INDEPENDENT_AMBULATORY_CARE_PROVIDER_SITE_OTHER): Payer: BLUE CROSS/BLUE SHIELD | Admitting: Physician Assistant

## 2016-02-16 DIAGNOSIS — F909 Attention-deficit hyperactivity disorder, unspecified type: Secondary | ICD-10-CM | POA: Diagnosis not present

## 2016-02-16 DIAGNOSIS — F988 Other specified behavioral and emotional disorders with onset usually occurring in childhood and adolescence: Secondary | ICD-10-CM

## 2016-02-16 MED ORDER — METHYLPHENIDATE HCL ER (OSM) 27 MG PO TBCR: 27.0000 mg | EXTENDED_RELEASE_TABLET | ORAL | 0 refills | Status: DC

## 2016-02-16 MED ORDER — METHYLPHENIDATE HCL ER (OSM) 27 MG PO TBCR: 27.0000 mg | EXTENDED_RELEASE_TABLET | Freq: Every day | ORAL | 0 refills | Status: DC

## 2016-02-16 NOTE — Progress Notes (Signed)
   Meriam SpragueJoshua J Spisak  MRN: [id number] DOB: 1970-10-21  Subjective:  Pt presents to clinic for medication refill.  He thinks that he may need to increase his dose.  He is noticing that in the early afternoon he is hitting a slump of increased fatigue and loack of fcous - he has tried to double his dose and that helped but he would like to try the dose in between.    Review of Systems  Constitutional: Negative for appetite change, chills, fever and unexpected weight change.  Respiratory: Negative for cough.   Cardiovascular: Negative for chest pain and leg swelling.    Patient Active Problem List   Diagnosis Date Noted  . ADD (attention deficit disorder) 09/07/2014  . Hypogonadism in male 08/19/2014  . Thyroid disorder 08/19/2014    Current Outpatient Prescriptions on File Prior to Visit  Medication Sig Dispense Refill  . NALTREXONE HCL PO Take by mouth daily.    Marland Kitchen. testosterone cypionate (DEPOTESTOTERONE CYPIONATE) 200 MG/ML injection Inject into the muscle once a week.     No current facility-administered medications on file prior to visit.     No Known Allergies  Pt patients past, family and social history were reviewed and updated.  Objective:  BP 110/70 (BP Location: Right Arm, Patient Position: Sitting, Cuff Size: Normal)   Pulse 72   Temp 98.1 F (36.7 C) (Oral)   Resp 16   Ht 5\' 11"  (1.803 m)   Wt 189 lb 9.6 oz (86 kg)   SpO2 95%   BMI 26.44 kg/m   Physical Exam  Constitutional: He is oriented to person, place, and time and well-developed, well-nourished, and in no distress.  HENT:  Head: Normocephalic and atraumatic.  Right Ear: External ear normal.  Left Ear: External ear normal.  Eyes: Conjunctivae are normal.  Neck: Normal range of motion.  Pulmonary/Chest: Effort normal.  Neurological: He is alert and oriented to person, place, and time. Gait normal.  Skin: Skin is warm and dry.  Psychiatric: Mood, memory, affect and judgment normal.    Assessment and  Plan :  ADD (attention deficit disorder) - Plan: methylphenidate 27 MG PO CR tablet, methylphenidate (CONCERTA) 27 MG PO CR tablet, methylphenidate (CONCERTA) 27 MG PO CR tablet  Increased medication to the next increment - he will contact me if he has problems.  Controlled substance contract signed.  Contact me in 3 moths for next set of Rx - recheck in 6 months.  Benny LennertSarah Raylen Ken PA-C  [facility] 02/16/2016 2:24 PM

## 2016-02-16 NOTE — Patient Instructions (Signed)
     IF you received an x-ray today, you will receive an invoice from [facility]. Please contact [facility] at [phone] with questions or concerns regarding your invoice.   IF you received labwork today, you will receive an invoice from [facility]. Please contact [facility] at [phone] with questions or concerns regarding your invoice.   Our billing staff will not be able to assist you with questions regarding bills from these companies.  You will be contacted with the lab results as soon as they are available. The fastest way to get your results is to activate your My Chart account. Instructions are located on the last page of this paperwork. If you have not heard from us regarding the results in 2 weeks, please contact this office.      

## 2016-04-11 DIAGNOSIS — R7989 Other specified abnormal findings of blood chemistry: Secondary | ICD-10-CM | POA: Diagnosis not present

## 2016-04-11 DIAGNOSIS — R5383 Other fatigue: Secondary | ICD-10-CM | POA: Diagnosis not present

## 2016-04-11 DIAGNOSIS — E559 Vitamin D deficiency, unspecified: Secondary | ICD-10-CM | POA: Diagnosis not present

## 2016-04-11 DIAGNOSIS — E063 Autoimmune thyroiditis: Secondary | ICD-10-CM | POA: Diagnosis not present

## 2016-05-22 ENCOUNTER — Telehealth: Payer: Self-pay

## 2016-05-22 DIAGNOSIS — F988 Other specified behavioral and emotional disorders with onset usually occurring in childhood and adolescence: Secondary | ICD-10-CM

## 2016-05-22 NOTE — Telephone Encounter (Signed)
Pt calling requesting a refill on Concerta. States that he has a weeks supply left but is leaving out [place] on [place] and therefore was trying to get ahead in refilling before he leaves. Thanks!

## 2016-05-25 MED ORDER — METHYLPHENIDATE HCL ER (OSM) 27 MG PO TBCR: 27.0000 mg | EXTENDED_RELEASE_TABLET | ORAL | 0 refills | Status: DC

## 2016-05-25 MED ORDER — METHYLPHENIDATE HCL ER (OSM) 27 MG PO TBCR: 27.0000 mg | EXTENDED_RELEASE_TABLET | Freq: Every day | ORAL | 0 refills | Status: DC

## 2016-05-25 NOTE — Telephone Encounter (Signed)
I have ready to pick- up - I have written that it is ok to fill early but please let him know that if it is to early his insurance may not pay for it.

## 2016-08-31 ENCOUNTER — Ambulatory Visit (INDEPENDENT_AMBULATORY_CARE_PROVIDER_SITE_OTHER): Payer: BLUE CROSS/BLUE SHIELD | Admitting: Physician Assistant

## 2016-08-31 DIAGNOSIS — F988 Other specified behavioral and emotional disorders with onset usually occurring in childhood and adolescence: Secondary | ICD-10-CM | POA: Diagnosis not present

## 2016-08-31 MED ORDER — METHYLPHENIDATE HCL ER (OSM) 27 MG PO TBCR: 27.0000 mg | EXTENDED_RELEASE_TABLET | ORAL | 0 refills | Status: DC

## 2016-08-31 MED ORDER — METHYLPHENIDATE HCL ER (OSM) 27 MG PO TBCR: 27.0000 mg | EXTENDED_RELEASE_TABLET | Freq: Every day | ORAL | 0 refills | Status: DC

## 2016-08-31 NOTE — Assessment & Plan Note (Signed)
Well controlled on current medications.  Continue current medications.  F/u in 6 months.

## 2016-08-31 NOTE — Patient Instructions (Signed)
     IF you received an x-ray today, you will receive an invoice from [facility]. Please contact [facility] at [phone] with questions or concerns regarding your invoice.   IF you received labwork today, you will receive an invoice from [facility]. Please contact [facility] at [phone] with questions or concerns regarding your invoice.   Our billing staff will not be able to assist you with questions regarding bills from these companies.  You will be contacted with the lab results as soon as they are available. The fastest way to get your results is to activate your My Chart account. Instructions are located on the last page of this paperwork. If you have not heard from us regarding the results in 2 weeks, please contact this office.     

## 2016-08-31 NOTE — Progress Notes (Signed)
   Paul Blackwell  MRN: [id number] DOB: 09/16/1970  PCP: No PCP Per Patient  Chief Complaint  Patient presents with  . Medication Refill    Concerta    Subjective:  Pt presents to clinic for medication follow-up and evaluation.  He takes the medication daily.  The dose is fine and is working well.  He is having no problems.      Review of Systems  Constitutional: Negative for appetite change, chills, fever and unexpected weight change.  Respiratory: Negative for cough.   Cardiovascular: Negative for chest pain and leg swelling.  Psychiatric/Behavioral: Negative for sleep disturbance.    Patient Active Problem List   Diagnosis Date Noted  . ADD (attention deficit disorder) 09/07/2014  . Hypogonadism in male 08/19/2014  . Thyroid disorder 08/19/2014    Current Outpatient Prescriptions on File Prior to Visit  Medication Sig Dispense Refill  . methylphenidate (CONCERTA) 27 MG PO CR tablet Take 1 tablet (27 mg total) by mouth every morning. 30 tablet 0  . NALTREXONE HCL PO Take by mouth daily.    Marland Kitchen testosterone cypionate (DEPOTESTOTERONE CYPIONATE) 200 MG/ML injection Inject into the muscle once a week.    . methylphenidate (CONCERTA) 27 MG PO CR tablet Take 1 tablet (27 mg total) by mouth every morning. (Patient not taking: Reported on 08/31/2016) 30 tablet 0  . methylphenidate 27 MG PO CR tablet Take 1 tablet (27 mg total) by mouth daily. (Patient not taking: Reported on 08/31/2016) 30 tablet 0   No current facility-administered medications on file prior to visit.     No Known Allergies  Pt patients past, family and social history were reviewed and updated.   Objective:  BP 121/77   Pulse 72   Temp 97.7 F (36.5 C) (Oral)   Resp 18   Ht  (1.803 m)   Wt 180 lb (81.6 kg)   SpO2 95%   BMI 25.10 kg/m   Physical Exam  Constitutional: He is oriented to person, place, and time and well-developed, well-nourished, and in no distress.  HENT:  Head: Normocephalic and  atraumatic.  Right Ear: External ear normal.  Left Ear: External ear normal.  Eyes: Conjunctivae are normal.  Neck: Normal range of motion.  Cardiovascular: Normal rate, regular rhythm and normal heart sounds.   No murmur heard. Pulmonary/Chest: Effort normal and breath sounds normal. He has no wheezes.  Neurological: He is alert and oriented to person, place, and time. Gait normal.  Skin: Skin is warm and dry.  Psychiatric: Mood, memory, affect and judgment normal.    Assessment and Plan :   Problem List Items Addressed This Visit      Other   ADD (attention deficit disorder)    Well controlled on current medications.  Continue current medications.  F/u in 6 months.      Relevant Medications   methylphenidate 27 MG PO CR tablet   methylphenidate (CONCERTA) 27 MG PO CR tablet   methylphenidate (CONCERTA) 27 MG PO CR tablet       Benny Lennert PA-C  [facility] 08/31/2016 9:49 AM

## 2016-12-03 ENCOUNTER — Ambulatory Visit (INDEPENDENT_AMBULATORY_CARE_PROVIDER_SITE_OTHER): Payer: BLUE CROSS/BLUE SHIELD | Admitting: Physician Assistant

## 2016-12-03 ENCOUNTER — Encounter: Payer: Self-pay | Admitting: Physician Assistant

## 2016-12-03 VITALS — BP 128/81 | HR 65 | Temp 98.5°F | Resp 18 | Ht 71.0 in | Wt 187.0 lb

## 2016-12-03 DIAGNOSIS — F988 Other specified behavioral and emotional disorders with onset usually occurring in childhood and adolescence: Secondary | ICD-10-CM

## 2016-12-03 DIAGNOSIS — L209 Atopic dermatitis, unspecified: Secondary | ICD-10-CM | POA: Diagnosis not present

## 2016-12-03 DIAGNOSIS — L237 Allergic contact dermatitis due to plants, except food: Secondary | ICD-10-CM | POA: Diagnosis not present

## 2016-12-03 MED ORDER — METHYLPHENIDATE HCL ER (OSM) 27 MG PO TBCR: 27.0000 mg | EXTENDED_RELEASE_TABLET | Freq: Every day | ORAL | 0 refills | Status: DC

## 2016-12-03 MED ORDER — METHYLPHENIDATE HCL ER (OSM) 27 MG PO TBCR: 27.0000 mg | EXTENDED_RELEASE_TABLET | ORAL | 0 refills | Status: DC

## 2016-12-03 MED ORDER — TRIAMCINOLONE ACETONIDE 0.5 % EX CREA: 1.0000 "application " | TOPICAL_CREAM | Freq: Two times a day (BID) | CUTANEOUS | 2 refills | Status: AC

## 2016-12-03 NOTE — Patient Instructions (Signed)
     IF you received an x-ray today, you will receive an invoice from [facility]. Please contact [facility] at [phone] with questions or concerns regarding your invoice.   IF you received labwork today, you will receive an invoice from [facility]. Please contact [facility] at [phone] with questions or concerns regarding your invoice.   Our billing staff will not be able to assist you with questions regarding bills from these companies.  You will be contacted with the lab results as soon as they are available. The fastest way to get your results is to activate your My Chart account. Instructions are located on the last page of this paperwork. If you have not heard from us regarding the results in 2 weeks, please contact this office.     

## 2016-12-03 NOTE — Progress Notes (Signed)
Paul Blackwell  MRN: [id number] DOB: Jan 04, 1971  PCP: Morrell Riddle, PA-C  Chief Complaint  Patient presents with  . Medication Refill    concerta  . Poison Ivy    legs,arms, and back     Subjective:  Pt presents to clinic for medication refills. 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      Review of Systems  Constitutional: Negative for appetite change, chills, fever and unexpected weight change.  Respiratory: Negative for cough.   Cardiovascular: Negative for chest pain and leg swelling.  Skin: Positive for rash.       itching  Psychiatric/Behavioral: Negative for sleep disturbance.    Patient Active Problem List   Diagnosis Date Noted  . ADD (attention deficit disorder) 09/07/2014  . Hypogonadism in male 08/19/2014  . Thyroid disorder 08/19/2014    Current Outpatient Prescriptions on File Prior to Visit  Medication Sig Dispense Refill  . NALTREXONE HCL PO Take by mouth daily.    Marland Kitchen testosterone cypionate (DEPOTESTOTERONE CYPIONATE) 200 MG/ML injection Inject into the muscle once a week.     No current facility-administered medications on file prior to visit.     No Known Allergies  Pt patients past, family and social history were reviewed and updated.   Objective:  BP 128/81   Pulse 65   Temp 98.5 F (36.9 C) (Oral)   Resp 18   Ht 5\' 11"  (1.803 m)    Wt 187 lb (84.8 kg)   SpO2 98%   BMI 26.08 kg/m   Physical Exam  Constitutional: He is oriented to person, place, and time and well-developed, well-nourished, and in no distress.  HENT:  Head: Normocephalic and atraumatic.  Right Ear: External ear normal.  Left Ear: External ear normal.  Eyes: Conjunctivae are normal.  Neck: Normal range of motion.  Cardiovascular: Normal rate, regular rhythm and normal heart sounds.   No murmur heard. Pulmonary/Chest: Effort normal and breath sounds normal. He has no wheezes.  Neurological: He is alert and oriented to person, place, and time. Gait normal.  Skin: Skin is warm and dry. Rash (vesicluar rash on lower legs c/w poison ivy -- rash on back is clusters of dry scaling papules eczema appear rash without surrouding erythema and no central clearning) noted.  Psychiatric: Mood, memory, affect and judgment normal.    Assessment and Plan :  Poison ivy - Plan: triamcinolone cream (KENALOG) 0.5 %  Attention deficit disorder (ADD) without hyperactivity - Plan: methylphenidate 27 MG PO CR tablet, methylphenidate (CONCERTA) 27 MG PO CR tablet, methylphenidate (CONCERTA) 27 MG PO CR tablet  Atopic dermatitis, unspecified type - Plan: triamcinolone cream (KENALOG) 0.5 % - ? related to food sensitivities and stress - pt to be mindful of the appearance of  the rash  Benny LennertSarah Weber PA-C  [facility] 12/05/2016 11:55 AM

## 2016-12-05 ENCOUNTER — Encounter: Payer: Self-pay | Admitting: Physician Assistant

## 2016-12-05 DIAGNOSIS — L209 Atopic dermatitis, unspecified: Secondary | ICD-10-CM | POA: Insufficient documentation

## 2017-03-19 ENCOUNTER — Encounter: Payer: Self-pay | Admitting: Physician Assistant

## 2017-03-19 ENCOUNTER — Telehealth: Payer: Self-pay | Admitting: Physician Assistant

## 2017-03-19 DIAGNOSIS — F988 Other specified behavioral and emotional disorders with onset usually occurring in childhood and adolescence: Secondary | ICD-10-CM

## 2017-03-19 MED ORDER — METHYLPHENIDATE HCL ER (OSM) 27 MG PO TBCR: 27.0000 mg | EXTENDED_RELEASE_TABLET | ORAL | 0 refills | Status: DC

## 2017-03-19 NOTE — Telephone Encounter (Signed)
Pt is needing a refill on concerta   Best number [phone]

## 2017-03-19 NOTE — Telephone Encounter (Signed)
Done

## 2017-03-19 NOTE — Telephone Encounter (Signed)
Please advise 

## 2017-03-21 MED ORDER — TESTOSTERONE CYPIONATE 200 MG/ML IM SOLN: INTRAMUSCULAR | 0 refills | Status: AC

## 2017-03-21 NOTE — Telephone Encounter (Signed)
Done

## 2017-05-13 ENCOUNTER — Emergency Department (HOSPITAL_COMMUNITY)
Admission: EM | Admit: 2017-05-13 | Discharge: 2017-05-13 | Disposition: A | Discharge status: Home / Self Care | Payer: BLUE CROSS/BLUE SHIELD | Attending: Emergency Medicine | Admitting: Emergency Medicine

## 2017-05-13 ENCOUNTER — Other Ambulatory Visit: Payer: Self-pay

## 2017-05-13 ENCOUNTER — Emergency Department (HOSPITAL_COMMUNITY): Payer: BLUE CROSS/BLUE SHIELD

## 2017-05-13 ENCOUNTER — Encounter (HOSPITAL_COMMUNITY): Payer: Self-pay | Admitting: Emergency Medicine

## 2017-05-13 DIAGNOSIS — F909 Attention-deficit hyperactivity disorder, unspecified type: Secondary | ICD-10-CM | POA: Diagnosis not present

## 2017-05-13 DIAGNOSIS — E079 Disorder of thyroid, unspecified: Secondary | ICD-10-CM | POA: Insufficient documentation

## 2017-05-13 DIAGNOSIS — R109 Unspecified abdominal pain: Secondary | ICD-10-CM | POA: Insufficient documentation

## 2017-05-13 DIAGNOSIS — K8051 Calculus of bile duct without cholangitis or cholecystitis with obstruction: Secondary | ICD-10-CM | POA: Diagnosis not present

## 2017-05-13 DIAGNOSIS — Z79899 Other long term (current) drug therapy: Secondary | ICD-10-CM | POA: Insufficient documentation

## 2017-05-13 DIAGNOSIS — K8050 Calculus of bile duct without cholangitis or cholecystitis without obstruction: Secondary | ICD-10-CM

## 2017-05-13 DIAGNOSIS — R948 Abnormal results of function studies of other organs and systems: Secondary | ICD-10-CM | POA: Diagnosis not present

## 2017-05-13 DIAGNOSIS — R1011 Right upper quadrant pain: Secondary | ICD-10-CM | POA: Diagnosis not present

## 2017-05-13 LAB — COMPREHENSIVE METABOLIC PANEL
ALBUMIN: 4 g/dL (ref 3.5–5.0)
ALK PHOS: 48 U/L (ref 38–126)
ALT: 52 U/L (ref 17–63)
AST: 66 U/L — ABNORMAL HIGH (ref 15–41)
Anion gap: 7 (ref 5–15)
BILIRUBIN TOTAL: 1 mg/dL (ref 0.3–1.2)
BUN: 14 mg/dL (ref 6–20)
CALCIUM: 9.3 mg/dL (ref 8.9–10.3)
CO2: 25 mmol/L (ref 22–32)
CREATININE: 1.34 mg/dL — AB (ref 0.61–1.24)
Chloride: 106 mmol/L (ref 101–111)
GFR calc Af Amer: >60 mL/min (ref >60)
GFR calc non Af Amer: >60 mL/min (ref >60)
GLUCOSE: 155 mg/dL — AB (ref 65–99)
Potassium: 3.5 mmol/L (ref 3.5–5.1)
SODIUM: 138 mmol/L (ref 135–145)
Total Protein: 6.7 g/dL (ref 6.5–8.1)

## 2017-05-13 LAB — URINALYSIS, ROUTINE W REFLEX MICROSCOPIC
BILIRUBIN URINE: NEGATIVE
Glucose, UA: NEGATIVE mg/dL
HGB URINE DIPSTICK: NEGATIVE
KETONES UR: 5 mg/dL — AB
Leukocytes, UA: NEGATIVE
Nitrite: NEGATIVE
Protein, ur: NEGATIVE mg/dL
SPECIFIC GRAVITY, URINE: 1.039 — AB (ref 1.005–1.030)
pH: 7 (ref 5.0–8.0)

## 2017-05-13 LAB — CBC
HCT: 45.3 % (ref 39.0–52.0)
HEMOGLOBIN: 16.3 g/dL (ref 13.0–17.0)
MCH: 31.8 pg (ref 26.0–34.0)
MCHC: 36 g/dL (ref 30.0–36.0)
MCV: 88.5 fL (ref 78.0–100.0)
PLATELETS: 294 10*3/uL (ref 150–400)
RBC: 5.12 MIL/uL (ref 4.22–5.81)
RDW: 11.8 % (ref 11.5–15.5)
WBC: 12.2 10*3/uL — ABNORMAL HIGH (ref 4.0–10.5)

## 2017-05-13 LAB — LIPASE, BLOOD: Lipase: 25 U/L (ref 11–51)

## 2017-05-13 MED ORDER — SODIUM CHLORIDE 0.9 % IV BOLUS (SEPSIS)
1000.0000 mL | Freq: Once | INTRAVENOUS | Status: AC
  Administered 2017-05-13: 1000 mL via INTRAVENOUS

## 2017-05-13 MED ORDER — ACETAMINOPHEN 325 MG PO TABS
650.0000 mg | ORAL_TABLET | Freq: Once | ORAL | Status: AC
  Administered 2017-05-13: 650 mg via ORAL
  Filled 2017-05-13: qty 2

## 2017-05-13 MED ORDER — PROMETHAZINE HCL 25 MG/ML IJ SOLN
12.5000 mg | Freq: Once | INTRAMUSCULAR | Status: AC
  Administered 2017-05-13: 12.5 mg via INTRAVENOUS
  Filled 2017-05-13: qty 1

## 2017-05-13 MED ORDER — HYDROMORPHONE HCL 1 MG/ML IJ SOLN
0.5000 mg | Freq: Once | INTRAMUSCULAR | Status: AC
  Administered 2017-05-13: 0.5 mg via INTRAVENOUS
  Filled 2017-05-13: qty 1

## 2017-05-13 MED ORDER — TECHNETIUM TC 99M MEBROFENIN IV KIT
4.8000 | PACK | Freq: Once | INTRAVENOUS | Status: AC | PRN
  Administered 2017-05-13: 4.8 via INTRAVENOUS

## 2017-05-13 MED ORDER — ONDANSETRON HCL 4 MG/2ML IJ SOLN
4.0000 mg | Freq: Once | INTRAMUSCULAR | Status: AC
  Administered 2017-05-13: 4 mg via INTRAVENOUS
  Filled 2017-05-13: qty 2

## 2017-05-13 MED ORDER — IOPAMIDOL (ISOVUE-300) INJECTION 61%
INTRAVENOUS | Status: AC
  Filled 2017-05-13: qty 100

## 2017-05-13 MED ORDER — ONDANSETRON HCL 4 MG PO TABS: 4.0000 mg | ORAL_TABLET | Freq: Four times a day (QID) | ORAL | 0 refills | Status: DC

## 2017-05-13 MED ORDER — IOPAMIDOL (ISOVUE-300) INJECTION 61%
100.0000 mL | Freq: Once | INTRAVENOUS | Status: AC | PRN
  Administered 2017-05-13: 100 mL via INTRAVENOUS

## 2017-05-13 MED ORDER — HYDROMORPHONE HCL 1 MG/ML IJ SOLN: 0.5000 mg | INTRAMUSCULAR | Status: DC | PRN

## 2017-05-13 MED ORDER — OXYCODONE-ACETAMINOPHEN 5-325 MG PO TABS: 2.0000 | ORAL_TABLET | Freq: Three times a day (TID) | ORAL | 0 refills | Status: DC | PRN

## 2017-05-13 NOTE — ED Notes (Signed)
ED Provider at bedside. 

## 2017-05-13 NOTE — ED Notes (Signed)
Bed: [facility] Expected date:  Expected time:  Means of arrival:  Comments: No bed 

## 2017-05-13 NOTE — Consult Note (Signed)
Reason for Consult:RUQ pain, nausea and vomiting Referring Physician: Dr Johny Blamer is an 46 y.o. male.  HPI: 46 y.o. M with 2 days of colicky type pain that worsens around 10 pm to midnight.  It was worse than the night before and he developed nausea and vomiting which prompted a visit to the ED.  He complains of pain that starts in the RUQ and migrates to the RLQ.  He denies pain to palpation.   History reviewed. No pertinent past medical history.  History reviewed. No pertinent surgical history.  Family History  Problem Relation Age of Onset  . Diabetes Maternal Grandfather   . Cancer Paternal Grandmother   . Hyperlipidemia Paternal Grandmother   . Cancer Paternal Grandfather     Social History:  reports that  has never smoked. he has never used smokeless tobacco. He reports that he does not drink alcohol or use drugs.  Allergies: No Known Allergies  Medications: I have reviewed the patient's current medications.  Results for orders placed or performed during the hospital encounter of 05/13/17 (from the past 48 hour(s))  Lipase, blood     Status: None   Collection Time: 05/13/17  1:59 AM  Result Value Ref Range   Lipase 25 11 - 51 U/L  Comprehensive metabolic panel     Status: Abnormal   Collection Time: 05/13/17  1:59 AM  Result Value Ref Range   Sodium 138 135 - 145 mmol/L   Potassium 3.5 3.5 - 5.1 mmol/L   Chloride 106 101 - 111 mmol/L   CO2 25 22 - 32 mmol/L   Glucose, Bld 155 (H) 65 - 99 mg/dL   BUN 14 6 - 20 mg/dL   Creatinine, Ser 1.34 (H) 0.61 - 1.24 mg/dL   Calcium 9.3 8.9 - 10.3 mg/dL   Total Protein 6.7 6.5 - 8.1 g/dL   Albumin 4.0 3.5 - 5.0 g/dL   AST 66 (H) 15 - 41 U/L   ALT 52 17 - 63 U/L   Alkaline Phosphatase 48 38 - 126 U/L   Total Bilirubin 1.0 0.3 - 1.2 mg/dL   GFR calc non Af Amer >60 >60 mL/min   GFR calc Af Amer >60 >60 mL/min    Comment: (NOTE) The eGFR has been calculated using the CKD EPI equation. This calculation has not  been validated in all clinical situations. eGFR's persistently <60 mL/min signify possible Chronic Kidney Disease.    Anion gap 7 5 - 15  CBC     Status: Abnormal   Collection Time: 05/13/17  1:59 AM  Result Value Ref Range   WBC 12.2 (H) 4.0 - 10.5 K/uL   RBC 5.12 4.22 - 5.81 MIL/uL   Hemoglobin 16.3 13.0 - 17.0 g/dL   HCT 45.3 39.0 - 52.0 %   MCV 88.5 78.0 - 100.0 fL   MCH 31.8 26.0 - 34.0 pg   MCHC 36.0 30.0 - 36.0 g/dL   RDW 11.8 11.5 - 15.5 %   Platelets 294 150 - 400 K/uL  Urinalysis, Routine w reflex microscopic     Status: Abnormal   Collection Time: 05/13/17  4:34 AM  Result Value Ref Range   Color, Urine YELLOW YELLOW   APPearance CLEAR CLEAR   Specific Gravity, Urine 1.039 (H) 1.005 - 1.030   pH 7.0 5.0 - 8.0   Glucose, UA NEGATIVE NEGATIVE mg/dL   Hgb urine dipstick NEGATIVE NEGATIVE   Bilirubin Urine NEGATIVE NEGATIVE   Ketones, ur 5 (A)  NEGATIVE mg/dL   Protein, ur NEGATIVE NEGATIVE mg/dL   Nitrite NEGATIVE NEGATIVE   Leukocytes, UA NEGATIVE NEGATIVE    Ct Abdomen Pelvis W Contrast  Result Date: 05/13/2017 CLINICAL DATA:  46 year old male with right-sided abdominal pain. EXAM: CT ABDOMEN AND PELVIS WITH CONTRAST TECHNIQUE: Multidetector CT imaging of the abdomen and pelvis was performed using the standard protocol following bolus administration of intravenous contrast. CONTRAST:  169m ISOVUE-300 IOPAMIDOL (ISOVUE-300) INJECTION 61% COMPARISON:  None. FINDINGS: Lower chest: The visualized lung bases are clear. No intra-abdominal free air or free fluid. Hepatobiliary: Subcentimeter hypodense lesion in the right lobe of the liver (series 2, image 20) is too small to characterize on this CT. MRI may provide better characterization. There is no intrahepatic biliary ductal dilatation. There is a 3 mm stone in the cystic duct. Mild haziness of the gallbladder wall. No pericholecystic fluid. An early acute cholecystitis is not excluded. Further evaluation with ultrasound  recommended. Pancreas: Unremarkable. No pancreatic ductal dilatation or surrounding inflammatory changes. Spleen: Normal in size without focal abnormality. Adrenals/Urinary Tract: Adrenal glands are unremarkable. Kidneys are normal, without renal calculi, focal lesion, or hydronephrosis. Bladder is unremarkable. Stomach/Bowel: Stomach is within normal limits. Appendix appears normal. No evidence of bowel wall thickening, distention, or inflammatory changes. Vascular/Lymphatic: The abdominal aorta and IVC appear unremarkable. There is a sacrum aortic left renal vein anatomy. No portal venous gas. There is no adenopathy. Reproductive: The prostate and seminal vesicles are grossly unremarkable. Other: None Musculoskeletal: No acute or significant osseous findings. IMPRESSION: 1. A 3 mm stone in the cystic duct with mild haziness of the gallbladder wall. Further evaluation with ultrasound recommended. 2. A subcentimeter right hepatic hypodense lesion, too small to characterize. MRI may provide better characterisation. 3. No bowel obstruction or active inflammation.  Normal appendix. Electronically Signed   By: AAnner CreteM.D.   On: 05/13/2017 03:33   UK[place]Abdomen Limited Ruq  Result Date: 05/13/2017 CLINICAL DATA:  Abdominal pain EXAM: ULTRASOUND ABDOMEN LIMITED RIGHT UPPER QUADRANT COMPARISON:  CT abdomen pelvis 05/13/2017 FINDINGS: Gallbladder: There is diffuse mild gallbladder wall thickening. No positive sonographic MPercell Millersign was demonstrated by the sonographer. No cholelithiasis is seen. The stone described on the earlier CT is not visible. Common bile duct: Diameter: 5.5 mm Liver: No focal lesion identified. Within normal limits in parenchymal echogenicity. Portal vein is patent on color Doppler imaging with normal direction of blood flow towards the liver. IMPRESSION: 1. Mild gallbladder wall thickening and diffusely but no visible cholelithiasis or positive sonographic Murphy sign. The small stones  seen on CT is not visualized. 2. No biliary dilatation. Electronically Signed   By: KUlyses JarredM.D.   On: 05/13/2017 05:35    Review of Systems  Constitutional: Negative for chills and fever.  HENT: Negative for hearing loss.   Respiratory: Negative for cough and shortness of breath.   Cardiovascular: Negative for chest pain.  Gastrointestinal: Positive for abdominal pain, nausea and vomiting.  Genitourinary: Negative for dysuria, frequency and urgency.  Neurological: Negative for dizziness and headaches.   Blood pressure 116/78, pulse (!) 109, temperature 98.1 F (36.7 C), temperature source Oral, resp. rate 18, SpO2 97 %. Physical Exam  Constitutional: He is oriented to person, place, and time. He appears well-developed and well-nourished. No distress.  HENT:  Head: Normocephalic and atraumatic.  Eyes: Conjunctivae and EOM are normal. Pupils are equal, round, and reactive to light.  Neck: Normal range of motion. Neck supple.  Cardiovascular: Normal rate and regular  rhythm.  Respiratory: No respiratory distress.  GI: Soft. He exhibits no distension and no mass. There is no tenderness. There is no rebound and no guarding.  Musculoskeletal: Normal range of motion.  Neurological: He is alert and oriented to person, place, and time.    Assessment/Plan: 46 y.o. M with resolving RUQ.  CT and RUQ [place] are inconclusive.  He does not really fit the picture of acalculus cholecystitis but does seem to have some biliary colic.  Would recommend HIDA eval.  If positive, he will need admission and cholecystectomy.  If neg, he can f/u in the office.    Novia Lansberry C. 06/12/9357, 7:56 AM

## 2017-05-13 NOTE — ED Provider Notes (Signed)
[facility]-EMERGENCY DEPT Provider Note   CSN: [id number] Arrival date & time: 05/13/17  0135     History   Chief Complaint Chief Complaint  Patient presents with  . Abdominal Pain    HPI Paul Blackwell is a 46 y.o. male.  HPI Paul Blackwell is a 46 y.o. male with history of thyroid disorder, hypogonadism, presents to emergency department complaining of severe abdominal pain.  Patient states his pain started this evening.  States started after eating pizza.  Reports several episodes of emesis.  States pain is sharp, constant.  Pain is in the center upper abdomen, radiates to the right into.  He reports similar symptoms several days ago which resolved.  Denies any flank pain.  Denies any urinary symptoms or hematuria normal bowel movements.  Denies any prior pain with eating.  Denies any fever or chills.  Denies any blood in his stool or emesis.  He has not tried any medications prior to coming in.  History reviewed. No pertinent past medical history.  Patient Active Problem List   Diagnosis Date Noted  . Atopic dermatitis 12/05/2016  . ADD (attention deficit disorder) 09/07/2014  . Hypogonadism in male 08/19/2014  . Thyroid disorder 08/19/2014    History reviewed. No pertinent surgical history.     Home Medications    Prior to Admission medications   Medication Sig Start Date End Date Taking? Authorizing Provider  methylphenidate (CONCERTA) 27 MG PO CR tablet Take 1 tablet (27 mg total) by mouth every morning. 03/19/17   Weber, Dema SeverinSarah L, PA-C  methylphenidate (CONCERTA) 27 MG PO CR tablet Take 1 tablet (27 mg total) by mouth every morning. 03/19/17   Weber, Dema SeverinSarah L, PA-C  methylphenidate (CONCERTA) 27 MG PO CR tablet Take 1 tablet (27 mg total) by mouth every morning. 03/19/17   Weber, Dema SeverinSarah L, PA-C  methylphenidate 27 MG PO CR tablet Take 1 tablet (27 mg total) by mouth daily. 12/03/16   Weber, Dema SeverinSarah L, PA-C  NALTREXONE HCL PO Take by mouth daily.     [provider]  testosterone cypionate (DEPOTESTOSTERONE CYPIONATE) 200 MG/ML injection 0.66ml 2x/week 03/21/17   Weber, Dema SeverinSarah L, PA-C  triamcinolone cream (KENALOG) 0.5 % Apply 1 application topically 2 (two) times daily. 12/03/16   Valarie ConesWeber, Dema SeverinSarah L, PA-C    Family History Family History  Problem Relation Age of Onset  . Diabetes Maternal Grandfather   . Cancer Paternal Grandmother   . Hyperlipidemia Paternal Grandmother   . Cancer Paternal Grandfather     Social History Social History   Tobacco Use  . Smoking status: Never Smoker  . Smokeless tobacco: Never Used  Substance Use Topics  . Alcohol use: No    Alcohol/week: 0.0 oz  . Drug use: No     Allergies   Patient has no known allergies.   Review of Systems Review of Systems  Constitutional: Negative for chills and fever.  Respiratory: Negative for cough, chest tightness and shortness of breath.   Cardiovascular: Negative for chest pain, palpitations and leg swelling.  Gastrointestinal: Positive for abdominal pain, nausea and vomiting. Negative for abdominal distention and diarrhea.  Genitourinary: Negative for dysuria, frequency, hematuria and urgency.  Musculoskeletal: Negative for arthralgias, myalgias, neck pain and neck stiffness.  Skin: Negative for rash.  Allergic/Immunologic: Negative for immunocompromised state.  Neurological: Negative for dizziness, weakness, light-headedness, numbness and headaches.  All other systems reviewed and are negative.    Physical Exam Updated Vital Signs BP (!) 151/104 (  BP Location: Right Arm)   Pulse 68   Temp 98.1 F (36.7 C) (Oral)   Resp 20   SpO2 100%   Physical Exam  Constitutional: He appears well-developed and well-nourished.  Pale, moaning in pain  HENT:  Head: Normocephalic and atraumatic.  Eyes: Conjunctivae are normal.  Neck: Neck supple.  Cardiovascular: Normal rate, regular rhythm and normal heart sounds.  Pulmonary/Chest: Effort normal. No  respiratory distress. He has no wheezes. He has no rales.  Abdominal: Soft. Bowel sounds are normal. He exhibits no distension. There is tenderness in the right upper quadrant and epigastric area. There is no rebound and no guarding.  Musculoskeletal: He exhibits no edema.  Neurological: He is alert.  Skin: Skin is warm and dry.  Nursing note and vitals reviewed.    ED Treatments / Results  Labs (all labs ordered are listed, but only abnormal results are displayed) Labs Reviewed  COMPREHENSIVE METABOLIC PANEL - Abnormal; Notable for the following components:      Result Value   Glucose, Bld 155 (*)    Creatinine, Ser 1.34 (*)    AST 66 (*)    All other components within normal limits  CBC - Abnormal; Notable for the following components:   WBC 12.2 (*)    All other components within normal limits  URINALYSIS, ROUTINE W REFLEX MICROSCOPIC - Abnormal; Notable for the following components:   Specific Gravity, Urine 1.039 (*)    Ketones, ur 5 (*)    All other components within normal limits  LIPASE, BLOOD    EKG  EKG Interpretation None       Radiology Ct Abdomen Pelvis W Contrast  Result Date: 05/13/2017 CLINICAL DATA:  46 year old male with right-sided abdominal pain. EXAM: CT ABDOMEN AND PELVIS WITH CONTRAST TECHNIQUE: Multidetector CT imaging of the abdomen and pelvis was performed using the standard protocol following bolus administration of intravenous contrast. CONTRAST:  ISOVUE-300 IOPAMIDOL (ISOVUE-300) INJECTION 61% COMPARISON:  None. FINDINGS: Lower chest: The visualized lung bases are clear. No intra-abdominal free air or free fluid. Hepatobiliary: Subcentimeter hypodense lesion in the right lobe of the liver (series 2, image 20) is too small to characterize on this CT. MRI may provide better characterization. There is no intrahepatic biliary ductal dilatation. There is a 3 mm stone in the cystic duct. Mild haziness of the gallbladder wall. No pericholecystic  fluid. An early acute cholecystitis is not excluded. Further evaluation with ultrasound recommended. Pancreas: Unremarkable. No pancreatic ductal dilatation or surrounding inflammatory changes. Spleen: Normal in size without focal abnormality. Adrenals/Urinary Tract: Adrenal glands are unremarkable. Kidneys are normal, without renal calculi, focal lesion, or hydronephrosis. Bladder is unremarkable. Stomach/Bowel: Stomach is within normal limits. Appendix appears normal. No evidence of bowel wall thickening, distention, or inflammatory changes. Vascular/Lymphatic: The abdominal aorta and IVC appear unremarkable. There is a sacrum aortic left renal vein anatomy. No portal venous gas. There is no adenopathy. Reproductive: The prostate and seminal vesicles are grossly unremarkable. Other: None Musculoskeletal: No acute or significant osseous findings. IMPRESSION: 1. A 3 mm stone in the cystic duct with mild haziness of the gallbladder wall. Further evaluation with ultrasound recommended. 2. A subcentimeter right hepatic hypodense lesion, too small to characterize. MRI may provide better characterisation. 3. No bowel obstruction or active inflammation.  Normal appendix. Electronically Signed   By: Elgie Collard M.D.   On: 05/13/2017 03:33   US Abdomen Limited Ruq  Result Date: 05/13/2017 CLINICAL DATA:  Abdominal pain EXAM: ULTRASOUND ABDOMEN LIMITED RIGHT  UPPER QUADRANT COMPARISON:  CT abdomen pelvis 05/13/2017 FINDINGS: Gallbladder: There is diffuse mild gallbladder wall thickening. No positive sonographic Eulah PontMurphy sign was demonstrated by the sonographer. No cholelithiasis is seen. The stone described on the earlier CT is not visible. Common bile duct: Diameter: 5.5 mm Liver: No focal lesion identified. Within normal limits in parenchymal echogenicity. Portal vein is patent on color Doppler imaging with normal direction of blood flow towards the liver. IMPRESSION: 1. Mild gallbladder wall thickening and  diffusely but no visible cholelithiasis or positive sonographic Murphy sign. The small stones seen on CT is not visualized. 2. No biliary dilatation. Electronically Signed   By: Deatra RobinsonKevin  Herman M.D.   On: 05/13/2017 05:35    Procedures Procedures (including critical care time)  Medications Ordered in ED Medications  sodium chloride 0.9 % bolus 1,000 mL (not administered)  HYDROmorphone (DILAUDID) injection 0.5 mg (not administered)  ondansetron (ZOFRAN) injection 4 mg (not administered)     Initial Impression / Assessment and Plan / ED Course  I have reviewed the triage vital signs and the nursing notes.  Pertinent labs & imaging results that were available during my care of the patient were reviewed by me and considered in my medical decision making (see chart for details).    Patient with constant sharp pain in the right upper abdomen and epigastric area that has been constant for several hours this evening.  Associated nausea vomiting.  Patient is pale, moaning and rolling around the stretcher in pain.  No history of prior abdominal issues.  He is otherwise relatively healthy.  On exam, bowel sounds are present, abdomen is tender diffusely but mainly in the upper abdomen.  Will check labs, get CT abdomen pelvis.  4:20 AM Pain mildly improved.  Continues to have some pain and nausea.  CT abdomen and pelvis showed a 3 mm stone in the cystic duct with mild haziness of the gallbladder wall.  Recommended ultrasound for further evaluation.  Will get ultrasound and order more medications.  6:16 AM Ultrasound showing mild gallbladder wall thickening, however no cholelithiasis or Murphy sign.  I discussed patient with Dr. Hinda GlatterNovotny who has seen patient as well.  He agrees that this may be acalculus cholecystitis.  I spoke with Dr. Dwain SarnaWakefield with general surgery who will come by and see patient.  Patient's pain did mildly improved.   Vitals:   05/13/17 0158 05/13/17 0440  BP: (!) 151/104 124/72    Pulse: 68 72  Resp: 20 18  Temp: 98.1 F (36.7 C)   TempSrc: Oral   SpO2: 100% 99%     Final Clinical Impressions(s) / ED Diagnoses   Final diagnoses:  Abdominal pain    ED Discharge Orders    None       Jaynie CrumbleKirichenko, Mea Ozga, PA-C 05/13/17 [id number]    Derwood KaplanNanavati, Ankit, MD 05/14/17 [phone]

## 2017-05-13 NOTE — Discharge Instructions (Signed)
Please call and [facility]  Take 650 mg of Tylenol or 600 mg of ibuprofen with food every 6 hours as needed for pain control.  For severe pain, you may take 1 tablet of Percocet every 8 hours as needed for pain.  Please note, this medication is a narcotic and can cause you to be impaired while working or driving.  It is also addicting.  Please do not use this medication for after work or drive.  You may take 1 tablet of Zofran once every 6 hours as needed for nausea.  If you develop new or worsening symptoms including uncontrollable pain, vomiting, or fever and chills, he is return to the emergency department for reevaluation.

## 2017-05-13 NOTE — ED Notes (Signed)
Paul BootsRenee Blackwell, wife, [phone]

## 2017-05-13 NOTE — ED Notes (Signed)
patient going to [facility]

## 2017-05-13 NOTE — ED Provider Notes (Signed)
46 year old male received Blackwell signout from [place] Kirichenko pending consult with Dr. Dwain SarnaWakefield with general surgery. Per her HPI:   "Paul Blackwell is Blackwell 46 y.o. male with history of thyroid disorder, hypogonadism, presents to emergency department complaining of severe abdominal pain.  Patient states his pain started this evening.  States started after eating pizza.  Reports several episodes of emesis.  States pain is sharp, constant.  Pain is in the center upper abdomen, radiates to the right into.  He reports similar symptoms several days ago which resolved.  Denies any flank pain.  Denies any urinary symptoms or hematuria normal bowel movements.  Denies any prior pain with eating.  Denies any fever or chills.  Denies any blood in his stool or emesis.  He has not tried any medications prior to coming in."  Physical Exam  BP 126/70 (BP Location: Left Arm)   Pulse 90   Temp 98.1 F (36.7 C) (Oral)   Resp 15   Ht 5' 10.5" (1.791 m)   Wt 83.9 kg (185 lb)   SpO2 97%   BMI 26.17 kg/m   Physical Exam  No TTP on abdominal exam. NAD. He appears comfortable. Symmetric tandem gait.  ED Course/Procedures     Procedures  MDM   46 year old male with Blackwell history of thyroid disorder, hypogonadism with severe abdominal pain received at sign out from [place] Kirichenko pending general surgery consult. The patient was seen by Dr. Maisie Fushomas who recommended HIDA scan and if positive, the patient will need to be admitted for cholecystectomy. HIDA scan is negative. Discussed these findings with the patient and his significant other. Will d/c the patient to home with antiemetic, pain medication, and f/u to general surgery. Strict return precautions given. NAD. He is hemodynamically stable and safe for d/c.        Frederik PearMcDonald, Paul Bible A, PA-C 05/13/17 2019

## 2017-05-13 NOTE — ED Triage Notes (Signed)
Pt from home with c/o right sided abdominal pain that began this evening, but is recurrent. Pt states he had pizza for dinner. Pt has had 1 episode of emesis. Pt denies diarrhea or other symptoms. Pt denies hx of abdominal illness or surgery

## 2017-05-15 ENCOUNTER — Telehealth: Payer: Self-pay | Admitting: Physician Assistant

## 2017-05-15 NOTE — Telephone Encounter (Signed)
Called pt to reschedule his appt with Sarah Weber on 05/28/17. Sarah has had a change in her schedule so we will need to reschedule him. ° °When he calls back, please reschedule him for 06/04/17 or after. ° °Thanks! ° °

## 2017-05-22 ENCOUNTER — Telehealth: Payer: Self-pay | Admitting: Physician Assistant

## 2017-05-22 NOTE — Telephone Encounter (Signed)
Copied from CRM [phone]#[id number]. Topic: Quick Communication - Rx Refill/Question >> May 22, 2017  9:23 AM Clack, Princella PellegriniJessica D wrote: Has the patient contacted their pharmacy? No.   (Agent: If no, request that the patient contact the pharmacy for the refill.)   Preferred Pharmacy (with phone number or street name): [id number] - [place], [place] - [phone] (Phone) [phone] (Fax)  Pt is requesting a med refill on his methylphenidate (CONCERTA) 27 MG PO CR tablet [[id number]][[id number]]. He had an appt with Weber on 05/28/17 but was cancel due to provider, he says he only has about 7 or 8 pills left. He did make an appt to see Weber 06/07/17   Agent: Please be advised that RX refills may take up to 3 business days. We ask that you follow-up with your pharmacy.

## 2017-05-22 NOTE — Telephone Encounter (Signed)
Patient is requesting refill of Concerta- has appointment 1/18

## 2017-05-22 NOTE — Telephone Encounter (Signed)
Please see note below. 

## 2017-05-22 NOTE — Telephone Encounter (Signed)
Copied from CRM #[id number]. Topic: Quick Communication - Rx Refill/Question °>> May 22, 2017  9:23 AM Clack, Jessica D wrote: °Has the patient contacted their pharmacy? No. ° ° °(Agent: If no, request that the patient contact the pharmacy for the refill.) ° ° °Preferred Pharmacy (with phone number or street name): [id number] - [place], [place] - [address] [phone] (Phone) [phone] (Fax) ° °Pt is requesting a med refill on his methylphenidate (CONCERTA) 27 MG PO CR tablet [[id number]]. He had an appt with Weber on 05/28/17 but was cancel due to provider, he says he only has about 7 or 8 pills left. He did make an appt to see Weber 06/07/17 ° ° °Agent: Please be advised that RX refills may take up to 3 business days. We ask that you follow-up with your pharmacy. °

## 2017-05-23 MED ORDER — METHYLPHENIDATE HCL ER (OSM) 27 MG PO TBCR: 27.0000 mg | EXTENDED_RELEASE_TABLET | ORAL | 0 refills | Status: DC

## 2017-05-23 NOTE — Telephone Encounter (Signed)
Meds ordered this encounter  Medications  . methylphenidate (CONCERTA) 27 MG PO CR tablet    Sig: Take 1 tablet (27 mg total) by mouth every morning.    Dispense:  30 tablet    Refill:  0    Order Specific Question:   Supervising Provider    Answer:   Clelia CroftSHAW, EVA N [[id number]]

## 2017-05-28 ENCOUNTER — Ambulatory Visit: Payer: BLUE CROSS/BLUE SHIELD | Admitting: Physician Assistant

## 2017-05-31 DIAGNOSIS — K8020 Calculus of gallbladder without cholecystitis without obstruction: Secondary | ICD-10-CM | POA: Diagnosis not present

## 2017-06-05 DIAGNOSIS — E7800 Pure hypercholesterolemia, unspecified: Secondary | ICD-10-CM | POA: Diagnosis not present

## 2017-06-07 ENCOUNTER — Other Ambulatory Visit: Payer: Self-pay

## 2017-06-07 ENCOUNTER — Encounter: Payer: Self-pay | Admitting: Physician Assistant

## 2017-06-07 ENCOUNTER — Ambulatory Visit (INDEPENDENT_AMBULATORY_CARE_PROVIDER_SITE_OTHER): Payer: BLUE CROSS/BLUE SHIELD | Admitting: Physician Assistant

## 2017-06-07 VITALS — BP 98/60 | HR 78 | Temp 97.7°F | Resp 18 | Ht 70.5 in | Wt 182.8 lb

## 2017-06-07 DIAGNOSIS — F988 Other specified behavioral and emotional disorders with onset usually occurring in childhood and adolescence: Secondary | ICD-10-CM

## 2017-06-07 MED ORDER — METHYLPHENIDATE HCL ER (OSM) 27 MG PO TBCR: 27.0000 mg | EXTENDED_RELEASE_TABLET | ORAL | 0 refills | Status: DC

## 2017-06-07 MED ORDER — METHYLPHENIDATE HCL ER (OSM) 27 MG PO TBCR
27.0000 mg | EXTENDED_RELEASE_TABLET | ORAL | 0 refills | Status: DC
Start: 2017-06-07 — End: 2017-09-06

## 2017-06-07 NOTE — Patient Instructions (Signed)
     IF you received an x-ray today, you will receive an invoice from [facility]. Please contact [facility] at [phone] with questions or concerns regarding your invoice.   IF you received labwork today, you will receive an invoice from [facility]. Please contact [facility] at [phone] with questions or concerns regarding your invoice.   Our billing staff will not be able to assist you with questions regarding bills from these companies.  You will be contacted with the lab results as soon as they are available. The fastest way to get your results is to activate your My Chart account. Instructions are located on the last page of this paperwork. If you have not heard from us regarding the results in 2 weeks, please contact this office.     

## 2017-06-07 NOTE — Progress Notes (Signed)
Paul SpragueJoshua J Blackwell  MRN: [id number] DOB: February 17, 1971  PCP: Morrell RiddleWeber, Sarah L, PA-C  Chief Complaint  Patient presents with  . Medication Refill    concerta     Subjective:  Pt presents to clinic for ADD  Medication refills.  Paul Blackwell is doing really well and having no problems  History is obtained by patient.  Review of Systems  Constitutional: Negative for appetite change, chills, fever and unexpected weight change.  Respiratory: Negative for cough.   Cardiovascular: Negative for chest pain and leg swelling.  Psychiatric/Behavioral: Negative for sleep disturbance.    Patient Active Problem List   Diagnosis Date Noted  . Atopic dermatitis 12/05/2016  . ADD (attention deficit disorder) 09/07/2014  . Hypogonadism in male 08/19/2014  . Thyroid disorder 08/19/2014    Current Outpatient Medications on File Prior to Visit  Medication Sig Dispense Refill  . b complex vitamins tablet Take 1 tablet by mouth daily.    Marland Kitchen. NALTREXONE HCL PO Take 4.5 mg by mouth daily.    . Omega-3 Fatty Acids (FISH OIL) 1000 MG CPDR Take 1 capsule by mouth daily.    . ondansetron (ZOFRAN) 4 MG tablet Take 1 tablet (4 mg total) by mouth every 6 (six) hours. 12 tablet 0  . OVER THE COUNTER MEDICATION Take 1 tablet by mouth daily. Vitamins A, D, and K    . oxyCODONE-acetaminophen (PERCOCET/ROXICET) 5-325 MG tablet Take 2 tablets by mouth every 8 (eight) hours as needed for severe pain. 10 tablet 0  . simethicone (MYLICON) 80 MG chewable tablet Chew 160 mg by mouth every 6 (six) hours as needed for flatulence.    . testosterone cypionate (DEPOTESTOSTERONE CYPIONATE) 200 MG/ML injection 0.26ml 2x/week (Patient taking differently: Inject 80 mg into the muscle 2 (two) times a week. 0.384ml 2x/week) 10 mL 0  . triamcinolone cream (KENALOG) 0.5 % Apply 1 application topically 2 (two) times daily. 45 g 2   No current facility-administered medications on file prior to visit.     No Known Allergies  History reviewed. No  pertinent past medical history. 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 Smoking status: Never Smoker  . Smokeless tobacco: Never Used  Substance Use Topics  . Alcohol use: No    Alcohol/week: 0.0 oz  . Drug use: No   family history includes Cancer in his paternal grandfather and paternal grandmother; Diabetes in his maternal grandfather; Hyperlipidemia in his paternal grandmother.     Objective:  BP 98/60   Pulse 78   Temp 97.7 F (36.5 C) (Oral)   Resp 18   Ht 5' 10.5" (1.791 m)   Wt 182 lb 12.8 oz (82.9 kg)   SpO2 97%   BMI 25.86 kg/m  Body mass index is 25.86 kg/m.  Physical Exam  Constitutional: Paul Blackwell is oriented to person, place, and time and well-developed, well-nourished, and in no distress.  HENT:  Head: Normocephalic and atraumatic.  Right Ear: External ear normal.  Left Ear: External ear normal.  Eyes: Conjunctivae are normal.  Neck: Normal range of motion.  Pulmonary/Chest: Effort normal.  Neurological: Paul Blackwell is alert and oriented to person, place, and time. Gait normal.  Skin: Skin is warm and dry.  Psychiatric: Mood, memory, affect and judgment normal.    Assessment and Plan :  Attention deficit disorder (ADD) without hyperactivity - Plan: methylphenidate (CONCERTA) 27 MG PO CR tablet, methylphenidate (  CONCERTA) 27 MG PO CR tablet, methylphenidate (CONCERTA) 27 MG PO CR tablet  Benny Lennert PA-C  Primary Care at [facility] 06/07/2017 10:19 AM

## 2017-07-04 DIAGNOSIS — K8020 Calculus of gallbladder without cholecystitis without obstruction: Secondary | ICD-10-CM | POA: Diagnosis not present

## 2017-07-09 DIAGNOSIS — K824 Cholesterolosis of gallbladder: Secondary | ICD-10-CM | POA: Diagnosis not present

## 2017-07-09 DIAGNOSIS — K8010 Calculus of gallbladder with chronic cholecystitis without obstruction: Secondary | ICD-10-CM | POA: Diagnosis not present

## 2017-07-09 DIAGNOSIS — K8020 Calculus of gallbladder without cholecystitis without obstruction: Secondary | ICD-10-CM | POA: Diagnosis not present

## 2017-07-09 HISTORY — PX: CHOLECYSTECTOMY: SHX55

## 2017-09-06 ENCOUNTER — Encounter: Payer: Self-pay | Admitting: Physician Assistant

## 2017-09-06 ENCOUNTER — Other Ambulatory Visit: Payer: Self-pay

## 2017-09-06 ENCOUNTER — Ambulatory Visit (INDEPENDENT_AMBULATORY_CARE_PROVIDER_SITE_OTHER): Payer: BLUE CROSS/BLUE SHIELD | Admitting: Physician Assistant

## 2017-09-06 DIAGNOSIS — F988 Other specified behavioral and emotional disorders with onset usually occurring in childhood and adolescence: Secondary | ICD-10-CM

## 2017-09-06 MED ORDER — METHYLPHENIDATE HCL ER (OSM) 18 MG PO TBCR: 18.0000 mg | EXTENDED_RELEASE_TABLET | ORAL | 0 refills | Status: AC

## 2017-09-06 NOTE — Patient Instructions (Signed)
     IF you received an x-ray today, you will receive an invoice from [facility]. Please contact [facility] at [phone] with questions or concerns regarding your invoice.   IF you received labwork today, you will receive an invoice from [facility]. Please contact [facility] at [phone] with questions or concerns regarding your invoice.   Our billing staff will not be able to assist you with questions regarding bills from these companies.  You will be contacted with the lab results as soon as they are available. The fastest way to get your results is to activate your My Chart account. Instructions are located on the last page of this paperwork. If you have not heard from us regarding the results in 2 weeks, please contact this office.     

## 2017-09-06 NOTE — Progress Notes (Signed)
Paul Blackwell  MRN: [id number] DOB: 09-12-1970  PCP: Morrell RiddleWeber, Baltazar Pekala L, PA-C  Chief Complaint  Patient presents with  . Medication Refill    methylphenidate (CONCERTA)- discussion  . Immunizations    tdap    Subjective:  Pt presents to clinic for medication recheck.  He has noticed over the last several months that he has had a high heart rate and jittery feeling after taking the concerta.  During this time his wife complained that something was just not right with him.  He has been off the medication for the last several weeks and his side effects have resolved and his wife feels like him problems have resolved since stopping the medications.  He does feel like he could still use medication because his attention has been lacking since being off the medications.  History is obtained by patient.  Review of Systems  Constitutional: Negative for appetite change, chills, fever and unexpected weight change.  Respiratory: Negative for cough.   Cardiovascular: Negative for chest pain and leg swelling.  Psychiatric/Behavioral: Negative for sleep disturbance.    Patient Active Problem List   Diagnosis Date Noted  . Atopic dermatitis 12/05/2016  . ADD (attention deficit disorder) 09/07/2014  . Hypogonadism in male 08/19/2014  . Thyroid disorder 08/19/2014    Current Outpatient Medications on File Prior to Visit  Medication Sig Dispense Refill  . b complex vitamins tablet Take 1 tablet by mouth daily.    Marland Kitchen. NALTREXONE HCL PO Take 4.5 mg by mouth daily.    . Omega-3 Fatty Acids (FISH OIL) 1000 MG CPDR Take 1 capsule by mouth daily.    Marland Kitchen. OVER THE COUNTER MEDICATION Take 1 tablet by mouth daily. Vitamins A, D, and K    . testosterone cypionate (DEPOTESTOSTERONE CYPIONATE) 200 MG/ML injection 0.436ml 2x/week (Patient taking differently: Inject 80 mg into the muscle 2 (two) times a week. 0.774ml 2x/week) 10 mL 0  . triamcinolone cream (KENALOG) 0.5 % Apply 1 application topically 2 (two) times  daily. 45 g 2  . simethicone (MYLICON) 80 MG chewable tablet Chew 160 mg by mouth every 6 (six) hours as needed for flatulence.     No current facility-administered medications on file prior to visit.     No Known Allergies  History reviewed. No pertinent past medical history. 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 Smoking status: Never Smoker  . Smokeless tobacco: Never Used  Substance Use Topics  . Alcohol use: No    Alcohol/week: 0.0 oz  . Drug use: No   family history includes Cancer in his paternal grandfather and paternal grandmother; Diabetes in his maternal grandfather; Hyperlipidemia in his paternal grandmother.     Objective:  BP 118/64 (BP Location: Right Arm, Patient Position: Sitting, Cuff Size: Normal)   Pulse 89   Temp 98.5 F (36.9 C) (Oral)   Resp 18   Ht 5' 11.38" (1.813 m)   Wt 181 lb 12.8 oz (82.5 kg)   SpO2 96%   BMI 25.09 kg/m  Body mass index is 25.09 kg/m.  Physical Exam  Constitutional: He is oriented to person, place, and time. He appears well-developed and well-nourished.  HENT:  Head: Normocephalic and atraumatic.  Right Ear: External ear normal.  Left Ear: External ear normal.  Eyes: Conjunctivae are normal.  Neck: Normal range of motion.  Cardiovascular: Normal rate, regular rhythm and normal  heart sounds.  No murmur heard. Pulmonary/Chest: Effort normal and breath sounds normal. He has no wheezes.  Neurological: He is alert and oriented to person, place, and time.  Skin: Skin is warm and dry.  Psychiatric: He has a normal mood and affect. His behavior is normal. Judgment and thought content normal.    Assessment and Plan :  Attention deficit disorder (ADD) without hyperactivity - Plan: methylphenidate 18 MG PO CR tablet decrease the dose - if this does not help consider change to a different medication - we will wait and see his response - he will  contact me through mychart with his results and response.  Benny Lennert PA-C  Primary Care at [facility] 09/06/2017 2:40 PM

## 2017-12-02 DIAGNOSIS — S43005D Unspecified dislocation of left shoulder joint, subsequent encounter: Secondary | ICD-10-CM | POA: Diagnosis not present

## 2017-12-02 DIAGNOSIS — M24112 Other articular cartilage disorders, left shoulder: Secondary | ICD-10-CM | POA: Diagnosis not present

## 2017-12-06 ENCOUNTER — Ambulatory Visit: Payer: BLUE CROSS/BLUE SHIELD | Admitting: Physician Assistant

## 2017-12-11 DIAGNOSIS — M25512 Pain in left shoulder: Secondary | ICD-10-CM | POA: Diagnosis not present

## 2017-12-13 DIAGNOSIS — S43005D Unspecified dislocation of left shoulder joint, subsequent encounter: Secondary | ICD-10-CM | POA: Diagnosis not present

## 2017-12-13 DIAGNOSIS — M24112 Other articular cartilage disorders, left shoulder: Secondary | ICD-10-CM | POA: Diagnosis not present

## 2017-12-19 ENCOUNTER — Ambulatory Visit: Payer: BLUE CROSS/BLUE SHIELD | Attending: Orthopedic Surgery | Admitting: Physical Therapy

## 2017-12-19 ENCOUNTER — Encounter: Payer: Self-pay | Admitting: Physical Therapy

## 2017-12-19 DIAGNOSIS — M25512 Pain in left shoulder: Secondary | ICD-10-CM | POA: Insufficient documentation

## 2017-12-19 DIAGNOSIS — M25612 Stiffness of left shoulder, not elsewhere classified: Secondary | ICD-10-CM | POA: Diagnosis not present

## 2017-12-19 NOTE — Therapy (Signed)
[facility] [address] [place], [place], [address] Phone: [phone]   Fax:  [phone]  Physical Therapy Evaluation  Patient Details  Name: Paul Blackwell MRN: [id number] Date of Birth: 04/13/1971 Referring Provider: Frederico Hamman MD   Encounter Date: Blackwell  PT End of Session - 12/19/17 1746    Visit Number  1    Number of Visits  12    Date for PT Re-Evaluation  02/13/18    PT Start Time  0451    PT Stop Time  0525    PT Time Calculation (min)  34 min       History reviewed. No pertinent past medical history.  Past Surgical History:  Procedure Laterality Date  . CHOLECYSTECTOMY  07/09/2017    There were no vitals filed for this visit.   Subjective Assessment - 12/19/17 1749    Subjective  The patient was white water kayaking in early June and felt a "pop" in his left shoulder.  He was in severe pain.  He had an MRI which revealed a anterior inferior labral tear.  He also received an injection which essentially resolved his pain.  He is very active and hopes to avoid surgery.  He will be out of town next week but will return to PT the week after next.    Pertinent History  Previous left shoulder injury about 18 years ago.    Patient Stated Goals  Avoid surgery and do all the ctivites I did before surgery.    Currently in Pain?  No/denies         [facility] PT Assessment - 12/19/17 0001      Assessment   Medical Diagnosis  Left anterior inferior labral tear.    Referring Provider  Frederico Hamman MD    Onset Date/Surgical Date  -- Early June 2019.      Precautions   Precautions  None      Restrictions   Weight Bearing Restrictions  No      Balance Screen   Has the patient fallen in the past 6 months  No    Has the patient had a decrease in activity level because of a fear of falling?   No    Is the patient reluctant to leave their home because of a fear of falling?   No      Home Environment   Living  Environment  Private residence      Prior Function   Level of Independence  Independent      Posture/Postural Control   Posture/Postural Control  Postural limitations    Postural Limitations  Rounded Shoulders;Forward head      ROM / Strength   AROM / PROM / Strength  AROM;Strength      AROM   Overall AROM Comments  Left active shoulder range of motion full with the exception of ER which is limited to 68 degrees.      Strength   Overall Strength Comments  Left deltoid strength= 5/5; IR= 5/5 and ER 4+/5.      Palpation   Palpation comment  No significant areas of palpable pain though he had increased tone near the humeral attachment of his left pect major.      Special Tests   Other special tests  Some mild pain reproduction with Impingement test.                Objective measurements completed on examination: See above findings.  PT Education - 12/19/17 1755    Education Details  HEP.          PT Long Term Goals - 12/19/17 1815      PT LONG TERM GOAL #1   Title  Independent with an advanced HEP.    Time  8    Period  Weeks    Status  New      PT LONG TERM GOAL #2   Title  Full right shoulder ER.    Time  8    Period  Weeks    Status  New      PT LONG TERM GOAL #3   Title  Solid 5/5 right shoulder strength grade to increase stability for functional tasks.    Time  8    Period  Weeks    Status  New      PT LONG TERM GOAL #4   Title  Perform all ADL's and recreational activities with right shoulder pain not > 1-2/10.    Time  8    Period  Weeks    Status  New             Plan - 12/19/17 1809    Clinical Impression Statement  The patient sustained a left shoulder labral injury (anterior inferior) while kayaking in early June of 2019.  He reports no pain after an injection.  He is limited into right shoulder ER.    History and Personal Factors relevant to plan of care:  Previous left shoulder injury about 18 years ago.     Clinical Presentation  Stable    Clinical Presentation due to:  Pain abated.    Clinical Decision Making  Low    Rehab Potential  Excellent    PT Frequency  2x / week    PT Duration  6 weeks    PT Treatment/Interventions  ADLs/Self Care Home Management;Cryotherapy;Electrical Stimulation;Ultrasound;Moist Heat;Therapeutic activities;Therapeutic exercise;Patient/family education;Manual techniques;Passive range of motion;Dry needling;Vasopneumatic Device    PT Next Visit Plan  Gentle stretching to achieve full right shoulder ER; RW4; [facility]; scapular strengthening; postural education; full can; prone rows; prone horizontal abduction.  Modalites PRN.    Consulted and Agree with Plan of Care  Patient       Patient will benefit from skilled therapeutic intervention in order to improve the following deficits and impairments:  Decreased activity tolerance, Pain, Decreased strength, Decreased range of motion  Visit Diagnosis: Acute pain of left shoulder - Plan: PT plan of care cert/re-cert  Stiffness of left shoulder, not elsewhere classified - Plan: PT plan of care cert/re-cert     Problem List Patient Active Problem List   Diagnosis Date Noted  . Atopic dermatitis 12/05/2016  . ADD (attention deficit disorder) 09/07/2014  . Hypogonadism in male 08/19/2014  . Thyroid disorder 08/19/2014    Paul Blackwell, Paul Blackwell, 6:18 PM  [facility] [address] [place], [place], [address] Phone: [phone]   Fax:  [phone]  Name: Paul Blackwell MRN: [id number] Date of Birth: 10/09/1970

## 2017-12-30 ENCOUNTER — Ambulatory Visit: Payer: BLUE CROSS/BLUE SHIELD | Admitting: Physical Therapy

## 2017-12-31 ENCOUNTER — Encounter: Payer: Self-pay | Admitting: Physical Therapy

## 2017-12-31 ENCOUNTER — Ambulatory Visit: Payer: BLUE CROSS/BLUE SHIELD | Admitting: Physical Therapy

## 2017-12-31 DIAGNOSIS — M25612 Stiffness of left shoulder, not elsewhere classified: Secondary | ICD-10-CM

## 2017-12-31 DIAGNOSIS — M25512 Pain in left shoulder: Secondary | ICD-10-CM

## 2017-12-31 NOTE — Therapy (Signed)
[facility] [address] [place], [place], [address] Phone: [phone]   Fax:  [phone]  Physical Therapy Treatment  Patient Details  Name: Paul Blackwell MRN: [id number] Date of Birth: 1970-06-14 Referring Provider: Frederico Hammananiel Caffrey MD   Encounter Date: 12/31/2017  PT End of Session - 12/31/17 1816    Visit Number  2    Number of Visits  12    Date for PT Re-Evaluation  02/13/18    PT Start Time  0445    PT Stop Time  0529    PT Time Calculation (min)  44 min       History reviewed. No pertinent past medical history.  Past Surgical History:  Procedure Laterality Date  . CHOLECYSTECTOMY  07/09/2017    There were no vitals filed for this visit.  Subjective Assessment - 12/31/17 1817    Subjective  Doing those home exercises you showed me.    Patient Stated Goals  Avoid surgery and do all the ctivites I did before surgery.                       [facility] - 12/31/17 0001      Exercises   Exercises  Shoulder      Shoulder Exercises: Standing   Other Standing Exercises  RW4 with red theraband to fatigue.      Shoulder Exercises: Pulleys   Flexion Limitations  5 minutes.      Shoulder Exercises: ROM/Strengthening   UBE (Upper Arm Bike)  8 minutes at 90 RPM's (4 mins forward and 4 mins backward).      Manual Therapy   Manual Therapy  Passive ROM    Passive ROM  Manual low load long duration stretching to patient's left shoulder in the plane of the scapula while receiving Combo e'stim/U/S at 1.50 W/CM2 x 10 minutes to patient's left anterior shoulder.             PT Education - 12/31/17 1819    Education Details  RW4.    Person(s) Educated  Patient    Methods  Explanation;Demonstration    Comprehension  Verbalized understanding;Returned demonstration          PT Long Term Goals - 12/19/17 1815      PT LONG TERM GOAL #1   Title  Independent with an advanced HEP.     Time  8    Period  Weeks    Status  New      PT LONG TERM GOAL #2   Title  Full right shoulder ER.    Time  8    Period  Weeks    Status  New      PT LONG TERM GOAL #3   Title  Solid 5/5 right shoulder strength grade to increase stability for functional tasks.    Time  8    Period  Weeks    Status  New      PT LONG TERM GOAL #4   Title  Perform all ADL's and recreational activities with right shoulder pain not > 1-2/10.    Time  8    Period  Weeks    Status  New            Plan - 12/31/17 1824    Clinical Impression Statement  The patient did great today with treatment.  He exhibited excellent technique with ther ex.  He needs continued work on left shoulder  ER which is still lacking.    PT Treatment/Interventions  ADLs/Self Care Home Management;Cryotherapy;Electrical Stimulation;Ultrasound;Moist Heat;Therapeutic activities;Therapeutic exercise;Patient/family education;Manual techniques;Passive range of motion;Dry needling;Vasopneumatic Device    PT Next Visit Plan  Gentle stretching to achieve full right shoulder ER; RW4; [facility]; scapular strengthening; postural education; full can; prone rows; prone horizontal abduction.  Modalites PRN.    Consulted and Agree with Plan of Care  Patient       Patient will benefit from skilled therapeutic intervention in order to improve the following deficits and impairments:  Decreased activity tolerance, Pain, Decreased strength, Decreased range of motion  Visit Diagnosis: Acute pain of left shoulder  Stiffness of left shoulder, not elsewhere classified     Problem List Patient Active Problem List   Diagnosis Date Noted  . Atopic dermatitis 12/05/2016  . ADD (attention deficit disorder) 09/07/2014  . Hypogonadism in male 08/19/2014  . Thyroid disorder 08/19/2014    APPLEGATE, ItalyHAD MPT 12/31/2017, 6:36 PM  [facility] [address] [place], [place], [address] Phone:  [phone]   Fax:  [phone]  Name: Paul Blackwell MRN: [id number] Date of Birth: November 15, 1970

## 2018-01-02 ENCOUNTER — Ambulatory Visit: Payer: BLUE CROSS/BLUE SHIELD | Admitting: *Deleted

## 2018-01-02 DIAGNOSIS — M25612 Stiffness of left shoulder, not elsewhere classified: Secondary | ICD-10-CM | POA: Diagnosis not present

## 2018-01-02 DIAGNOSIS — M25512 Pain in left shoulder: Secondary | ICD-10-CM

## 2018-01-02 NOTE — Therapy (Signed)
[facility] [address] [place], [place], [address] Phone: [phone]   Fax:  [phone]  Physical Therapy Treatment  Patient Details  Name: Paul Blackwell MRN: [id number] Date of Birth: 1971-03-05 Referring Provider: Frederico Hammananiel Caffrey MD   Encounter Date: 01/02/2018  PT End of Session - 01/02/18 1649    Visit Number  3    Number of Visits  12    Date for PT Re-Evaluation  02/13/18    PT Start Time  1645    PT Stop Time  1735    PT Time Calculation (min)  50 min       No past medical history on file.  Past Surgical History:  Procedure Laterality Date  . CHOLECYSTECTOMY  07/09/2017    There were no vitals filed for this visit.  Subjective Assessment - 01/02/18 1647    Subjective  2/10 LT shldr    Pertinent History  Previous left shoulder injury about 18 years ago.    Patient Stated Goals  Avoid surgery and do all the ctivites I did before surgery.    Currently in Pain?  Yes    Pain Score  2     Pain Location  Shoulder                       [facility] Adult PT Treatment/Exercise - 01/02/18 0001      Exercises   Exercises  Shoulder      Shoulder Exercises: Standing   Other Standing Exercises  RW4 with red theraband to fatigue.      Shoulder Exercises: Pulleys   Flexion Limitations  --      Shoulder Exercises: ROM/Strengthening   UBE (Upper Arm Bike)  8 minutes at 90 RPM's (4 mins forward and 4 mins backward).      Manual Therapy   Manual Therapy  Passive ROM    Passive ROM  Manual low load long duration stretching to patient's left shoulder in the plane of the scapula while receiving Combo e'stim/U/S at 1.50 W/CM2 x 10 minutes to patient's left anterior shoulder. F/B IASTM to ant. aspect                   PT Long Term Goals - 12/19/17 1815      PT LONG TERM GOAL #1   Title  Independent with an advanced HEP.    Time  8    Period  Weeks    Status  New      PT LONG TERM GOAL #2   Title   Full right shoulder ER.    Time  8    Period  Weeks    Status  New      PT LONG TERM GOAL #3   Title  Solid 5/5 right shoulder strength grade to increase stability for functional tasks.    Time  8    Period  Weeks    Status  New      PT LONG TERM GOAL #4   Title  Perform all ADL's and recreational activities with right shoulder pain not > 1-2/10.    Time  8    Period  Weeks    Status  New            Plan - 01/02/18 1649    Clinical Impression Statement  Pt arrived today with minimal pain in LT shldr and felt like last Rx helped. He did well with therex with mainly fatigue.  Improved LT shldr rotation today after PROM and US/ Combo to 75 degrees.    Clinical Presentation  Stable    Rehab Potential  Excellent    PT Frequency  2x / week    PT Duration  6 weeks    PT Treatment/Interventions  ADLs/Self Care Home Management;Cryotherapy;Electrical Stimulation;Ultrasound;Moist Heat;Therapeutic activities;Therapeutic exercise;Patient/family education;Manual techniques;Passive range of motion;Dry needling;Vasopneumatic Device    PT Next Visit Plan  Gentle stretching to achieve full right shoulder ER; RW4; [facility] ER; scapular strengthening; postural education; full can; prone rows; prone horizontal abduction.  Modalites PRN.    Consulted and Agree with Plan of Care  Patient       Patient will benefit from skilled therapeutic intervention in order to improve the following deficits and impairments:  Decreased activity tolerance, Pain, Decreased strength, Decreased range of motion  Visit Diagnosis: Acute pain of left shoulder  Stiffness of left shoulder, not elsewhere classified     Problem List Patient Active Problem List   Diagnosis Date Noted  . Atopic dermatitis 12/05/2016  . ADD (attention deficit disorder) 09/07/2014  . Hypogonadism in male 08/19/2014  . Thyroid disorder 08/19/2014    Alrick Cubbage,[name], PTA 01/02/2018, 5:48 PM  [facility] [address] [place], [place], [address] Phone: [phone]   Fax:  [phone]  Name: Paul Blackwell MRN: [id number] Date of Birth: Aug 31, 1970

## 2018-01-07 ENCOUNTER — Ambulatory Visit: Payer: BLUE CROSS/BLUE SHIELD | Admitting: Physical Therapy

## 2018-01-07 DIAGNOSIS — M25512 Pain in left shoulder: Secondary | ICD-10-CM

## 2018-01-07 DIAGNOSIS — M25612 Stiffness of left shoulder, not elsewhere classified: Secondary | ICD-10-CM | POA: Diagnosis not present

## 2018-01-07 NOTE — Therapy (Signed)
[facility] [address] [place], [place], [address] Phone: [phone]   Fax:  [phone]  Physical Therapy Treatment  Patient Details  Name: Paul Blackwell MRN: [id number] Date of Birth: [birth year] Referring Provider: Frederico Hammananiel Caffrey MD   Encounter Date: 01/07/2018  PT End of Session - 01/07/18 1646    Visit Number  4    Number of Visits  12    Date for PT Re-Evaluation  02/13/18    PT Start Time  1644    PT Stop Time  1727    PT Time Calculation (min)  43 min    Activity Tolerance  Patient tolerated treatment well    Behavior During Therapy  [facility] for tasks assessed/performed       No past medical history on file.  Past Surgical History:  Procedure Laterality Date  . CHOLECYSTECTOMY  07/09/2017    There were no vitals filed for this visit.  Subjective Assessment - 01/07/18 1646    Subjective  Patient reports shoulder is feeling good with minimal pain; still reports increase of pain when going to end ranges of ROM.    Pertinent History  Previous left shoulder injury about 18 years ago.    Patient Stated Goals  Avoid surgery and do all the ctivites I did before surgery.    Currently in Pain?  Yes   "mimimal, did not provide number   Pain Location  Shoulder    Pain Orientation  Left         [facility] PT Assessment - 01/07/18 0001      Assessment   Medical Diagnosis  Left anterior inferior labral tear.                   [facility]/Exercise - 01/07/18 0001      Exercises   Exercises  Shoulder      Shoulder Exercises: Prone   Horizontal ABduction 1  Strengthening;Left;Weights;Other (comment)   Until fatigue   Horizontal ABduction 1 Weight (lbs)  3#    Other Prone Exercises  Prone row 3# until fatigue      Shoulder Exercises: Standing   Other Standing Exercises  RW4 with red theraband to fatigue.      Shoulder Exercises: ROM/Strengthening   UBE (Upper Arm Bike)  8 minutes at 90 RPM's (4  mins forward and 4 mins backward).      Manual Therapy   Manual Therapy  Passive ROM    Passive ROM  Manual low load long duration stretching of patient's left shoulder into ER in the plane of the scapula while receiving Combo e'stim/U/S at 1.50 W/CM2 x 12 minutes to patient's left anterior shoulder.                  PT Long Term Goals - 12/19/17 1815      PT LONG TERM GOAL #1   Title  Independent with an advanced HEP.    Time  8    Period  Weeks    Status  New      PT LONG TERM GOAL #2   Title  Full right shoulder ER.    Time  8    Period  Weeks    Status  New      PT LONG TERM GOAL #3   Title  Solid 5/5 right shoulder strength grade to increase stability for functional tasks.    Time  8    Period  Weeks  Status  New      PT LONG TERM GOAL #4   Title  Perform all ADL's and recreational activities with right shoulder pain not > 1-2/10.    Time  8    Period  Weeks    Status  New            Plan - 01/07/18 1741    Clinical Impression Statement  Patient was able to tolerate treatment well with no reports of pain during exercises. Patient was able to demonstrate good form with new TEs after initial demonstration. Patient was able to tolerate 8 minutes of low load duration stretch into ER during Combo e-stim/US. Patient noted significant improvements to ER ROM since start of therapy. Normal response to modalities at end of session.    Clinical Presentation  Stable    Clinical Decision Making  Low    Rehab Potential  Excellent    PT Frequency  2x / week    PT Duration  6 weeks    PT Treatment/Interventions  ADLs/Self Care Home Management;Cryotherapy;Electrical Stimulation;Ultrasound;Moist Heat;Therapeutic activities;Therapeutic exercise;Patient/family education;Manual techniques;Passive range of motion;Dry needling;Vasopneumatic Device    PT Next Visit Plan  Gentle stretching to achieve full right shoulder ER; RW4; [facility] ER; scapular strengthening; postural  education; full can; prone rows; prone horizontal abduction.  Modalites PRN.    Consulted and Agree with Plan of Care  Patient       Patient will benefit from skilled therapeutic intervention in order to improve the following deficits and impairments:  Decreased activity tolerance, Pain, Decreased strength, Decreased range of motion  Visit Diagnosis: Acute pain of left shoulder  Stiffness of left shoulder, not elsewhere classified     Problem List Patient Active Problem List   Diagnosis Date Noted  . Atopic dermatitis 12/05/2016  . ADD (attention deficit disorder) 09/07/2014  . Hypogonadism in male 08/19/2014  . Thyroid disorder 08/19/2014   Guss BundeKrystle Nery Frappier, PT, DPT 01/07/2018, 5:49 PM  [facility] [address] [place], [place], [address] Phone: [phone]   Fax:  [phone]  Name: Paul Blackwell MRN: [id number] Date of Birth: 10/22/1970

## 2018-01-08 DIAGNOSIS — E291 Testicular hypofunction: Secondary | ICD-10-CM | POA: Diagnosis not present

## 2018-01-08 DIAGNOSIS — E069 Thyroiditis, unspecified: Secondary | ICD-10-CM | POA: Diagnosis not present

## 2018-01-09 ENCOUNTER — Ambulatory Visit: Payer: BLUE CROSS/BLUE SHIELD | Admitting: *Deleted

## 2018-01-09 DIAGNOSIS — M25612 Stiffness of left shoulder, not elsewhere classified: Secondary | ICD-10-CM | POA: Diagnosis not present

## 2018-01-09 DIAGNOSIS — M25512 Pain in left shoulder: Secondary | ICD-10-CM | POA: Diagnosis not present

## 2018-01-09 NOTE — Therapy (Signed)
[facility] [address] [place], [place], [address] Phone: [phone]   Fax:  [phone]  Physical Therapy Treatment  Patient Details  Name: GEORG ANG MRN: [id number] Date of Birth: [birth year] Referring Provider: Frederico Hamman MD   Encounter Date: 01/09/2018  PT End of Session - 01/09/18 1623    Visit Number  5    Number of Visits  12    Date for PT Re-Evaluation  02/13/18    PT Start Time  1605    PT Stop Time  1650    PT Time Calculation (min)  45 min    Activity Tolerance  Patient tolerated treatment well    Behavior During Therapy  [facility] for tasks assessed/performed       No past medical history on file.  Past Surgical History:  Procedure Laterality Date  . CHOLECYSTECTOMY  07/09/2017    There were no vitals filed for this visit.  Subjective Assessment - 01/09/18 1624    Subjective  Patient reports shoulder is feeling good with minimal pain; still reports increase of pain when going to end ranges of ROM.    Pertinent History  Previous left shoulder injury about 18 years ago.    Patient Stated Goals  Avoid surgery and do all the ctivites I did before surgery.    Currently in Pain?  Yes    Pain Score  1     Pain Location  Shoulder    Pain Orientation  Left    Pain Descriptors / Indicators  Sore;Aching                       [facility] Adult PT Treatment/Exercise - 01/09/18 0001      Exercises   Exercises  Shoulder      Shoulder Exercises: Prone   Horizontal ABduction 1  Strengthening;Left;Weights;Other (comment)    Horizontal ABduction 1 Weight (lbs)  3#    Other Prone Exercises  Prone row 5# until fatigue      Shoulder Exercises: Standing   Other Standing Exercises  RW4 with red theraband to fatigue.      Shoulder Exercises: ROM/Strengthening   UBE (Upper Arm Bike)  8 minutes at 90 RPM's (4 mins forward and 4 mins backward).      Manual Therapy   Manual Therapy  Passive ROM    Passive ROM   Manual low load long duration stretching of patient's left shoulder into ER in the plane of the scapula while receiving Combo e'stim/U/S at 1.50 W/CM2 x 10 minutes to patient's left anterior shoulder.                  PT Long Term Goals - 01/09/18 1629      PT LONG TERM GOAL #1   Title  Independent with an advanced HEP.    Period  Weeks    Status  On-going      PT LONG TERM GOAL #2   Title  Full right shoulder ER.    Period  Weeks    Status  On-going      PT LONG TERM GOAL #3   Title  Solid 5/5 right shoulder strength grade to increase stability for functional tasks.    Time  8    Period  Weeks    Status  On-going      PT LONG TERM GOAL #4   Title  Perform all ADL's and recreational activities with right shoulder pain not > 1-2/10.  Time  8    Period  Weeks    Status  Achieved            Plan - 01/09/18 1718    Clinical Impression Statement  Pt arrived today doing fairly well with Low pain levels with ADL's and was able to meet LTG for ADLs today. He did well with therex with minimal pain and mainly fatigue LT shldr. Pt  still has pain increase during ER PROM, but ER ROM continues to progress and measured at 65 degrees today.    Clinical Presentation  Stable    Rehab Potential  Excellent    PT Frequency  2x / week    PT Duration  6 weeks    PT Treatment/Interventions  ADLs/Self Care Home Management;Cryotherapy;Electrical Stimulation;Ultrasound;Moist Heat;Therapeutic activities;Therapeutic exercise;Patient/family education;Manual techniques;Passive range of motion;Dry needling;Vasopneumatic Device    PT Next Visit Plan  Gentle stretching to achieve full right shoulder ER; RW4; [facility] ER; scapular strengthening; postural education; full can; prone rows; prone horizontal abduction.  Modalites PRN.    Consulted and Agree with Plan of Care  Patient       Patient will benefit from skilled therapeutic intervention in order to improve the following deficits and  impairments:  Decreased activity tolerance, Pain, Decreased strength, Decreased range of motion  Visit Diagnosis: Acute pain of left shoulder  Stiffness of left shoulder, not elsewhere classified     Problem List Patient Active Problem List   Diagnosis Date Noted  . Atopic dermatitis 12/05/2016  . ADD (attention deficit disorder) 09/07/2014  . Hypogonadism in male 08/19/2014  . Thyroid disorder 08/19/2014    Janese Radabaugh,[name], PTA 01/09/2018, 5:24 PM  [facility] [address] [place], [place], [address] Phone: [phone]   Fax:  [phone]  Name: Meriam SpragueJoshua J Hurta MRN: [id number] Date of Birth: 07-06-1970

## 2018-01-14 ENCOUNTER — Ambulatory Visit: Payer: BLUE CROSS/BLUE SHIELD | Admitting: Physical Therapy

## 2018-01-14 DIAGNOSIS — M25612 Stiffness of left shoulder, not elsewhere classified: Secondary | ICD-10-CM | POA: Diagnosis not present

## 2018-01-14 DIAGNOSIS — M25512 Pain in left shoulder: Secondary | ICD-10-CM

## 2018-01-14 NOTE — Therapy (Signed)
[facility] [address] [place], [place], [address] Phone: [phone]   Fax:  [phone]  Physical Therapy Treatment  Patient Details  Name: Paul Blackwell MRN: [id number] Date of Birth: [birth year] Referring Provider: Frederico Hamman MD   Encounter Date: 01/14/2018  PT End of Session - 01/14/18 1742    Visit Number  6    Number of Visits  12    Date for PT Re-Evaluation  02/13/18    PT Start Time  1645    PT Stop Time  1731    PT Time Calculation (min)  46 min    Activity Tolerance  Patient tolerated treatment well    Behavior During Therapy  [facility] for tasks assessed/performed       No past medical history on file.  Past Surgical History:  Procedure Laterality Date  . CHOLECYSTECTOMY  07/09/2017    There were no vitals filed for this visit.      [facility] PT Assessment - 01/14/18 0001      Assessment   Medical Diagnosis  Left anterior inferior labral tear.      Precautions   Precautions  None      Restrictions   Weight Bearing Restrictions  No                   [facility] Adult PT Treatment/Exercise - 01/14/18 0001      Exercises   Exercises  Shoulder      Shoulder Exercises: Prone   Other Prone Exercises  Prone Ys 3# until fatigue      Shoulder Exercises: Standing   Other Standing Exercises  RW4 with red theraband (ER) green TB (IR, Row, protract) to fatigue.    Other Standing Exercises  scapular clocks red theraband (12 9 7  6) to fatigue      Shoulder Exercises: ROM/Strengthening   UBE (Upper Arm Bike)  8 minutes at 60 RPM's (4 mins forward and 4 mins backward).      Manual Therapy   Manual Therapy  Passive ROM    Passive ROM  Manual low load long duration stretching of patient's left shoulder into ER in the plane of the scapula while receiving Combo e'stim/U/S at 1.50 W/CM2 x 12 minutes to patient's left anterior shoulder.                  PT Long Term Goals - 01/09/18 1629      PT  LONG TERM GOAL #1   Title  Independent with an advanced HEP.    Period  Weeks    Status  On-going      PT LONG TERM GOAL #2   Title  Full right shoulder ER.    Period  Weeks    Status  On-going      PT LONG TERM GOAL #3   Title  Solid 5/5 right shoulder strength grade to increase stability for functional tasks.    Time  8    Period  Weeks    Status  On-going      PT LONG TERM GOAL #4   Title  Perform all ADL's and recreational activities with right shoulder pain not > 1-2/10.    Time  8    Period  Weeks    Status  Achieved            Plan - 01/14/18 1745    Clinical Impression Statement  Patient was able to tolerate treatment well with minimal reports of fatigue  throughout exercises. Patient increased to green TB with IR, row and protraction with good form. Patient noted with overall improvements in range of motion as he is having greater ease with reaching in his back and dressing. No adverse effects during combo e-stim/US.     Clinical Presentation  Stable    Clinical Decision Making  Low    Rehab Potential  Excellent    PT Frequency  2x / week    PT Duration  6 weeks    PT Treatment/Interventions  ADLs/Self Care Home Management;Cryotherapy;Electrical Stimulation;Ultrasound;Moist Heat;Therapeutic activities;Therapeutic exercise;Patient/family education;Manual techniques;Passive range of motion;Dry needling;Vasopneumatic Device    PT Next Visit Plan  Gentle stretching to achieve full right shoulder ER; RW4; [facility] ER; scapular strengthening; postural education; full can; prone rows; prone horizontal abduction.  Modalites PRN.    Consulted and Agree with Plan of Care  Patient       Patient will benefit from skilled therapeutic intervention in order to improve the following deficits and impairments:  Decreased activity tolerance, Pain, Decreased strength, Decreased range of motion  Visit Diagnosis: Acute pain of left shoulder  Stiffness of left shoulder, not elsewhere  classified     Problem List Patient Active Problem List   Diagnosis Date Noted  . Atopic dermatitis 12/05/2016  . ADD (attention deficit disorder) 09/07/2014  . Hypogonadism in male 08/19/2014  . Thyroid disorder 08/19/2014   Guss BundeKrystle Jakim Drapeau, PT, DPT 01/14/2018, 5:51 PM  [facility] [address] [place], [place], [address] Phone: [phone]   Fax:  [phone]  Name: Paul Blackwell MRN: [id number] Date of Birth: 1971/02/04

## 2018-01-16 ENCOUNTER — Ambulatory Visit: Payer: BLUE CROSS/BLUE SHIELD | Admitting: *Deleted

## 2018-01-16 DIAGNOSIS — M25512 Pain in left shoulder: Secondary | ICD-10-CM

## 2018-01-16 DIAGNOSIS — M25612 Stiffness of left shoulder, not elsewhere classified: Secondary | ICD-10-CM | POA: Diagnosis not present

## 2018-01-16 NOTE — Therapy (Signed)
[facility] [address] [place], [place], [address] Phone: [phone]   Fax:  [phone]  Physical Therapy Treatment  Patient Details  Name: Paul SpragueJoshua J Fergeson MRN: [id number] Date of Birth: 10/25/1970 Referring Provider: Frederico Hammananiel Caffrey MD   Encounter Date: 01/16/2018  PT End of Session - 01/16/18 1653    Visit Number  7    Number of Visits  12    Date for PT Re-Evaluation  02/13/18    PT Start Time  1645    PT Stop Time  1735    PT Time Calculation (min)  50 min       No past medical history on file.  Past Surgical History:  Procedure Laterality Date  . CHOLECYSTECTOMY  07/09/2017    There were no vitals filed for this visit.                    [facility] Adult PT Treatment/Exercise - 01/16/18 0001      Exercises   Exercises  Shoulder      Shoulder Exercises: Prone   Other Prone Exercises  Prone Ys 3# until fatigue      Shoulder Exercises: Standing   Other Standing Exercises  RW4 with green theraband (ER) green TB (IR, Row, protract) to fatigue.      Shoulder Exercises: ROM/Strengthening   UBE (Upper Arm Bike)  8 minutes at 60 RPM's (4 mins forward and 4 mins backward).      Manual Therapy   Manual Therapy  Passive ROM    Passive ROM  Manual low load long duration stretching of patient's left shoulder into ER in the plane of the scapula while receiving Combo e'stim/U/S at 1.50 W/CM2 x 12 minutes to patient's left anterior shoulder.                  PT Long Term Goals - 01/16/18 1658      PT LONG TERM GOAL #1   Title  Independent with an advanced HEP.    Time  8    Period  Weeks    Status  Achieved      PT LONG TERM GOAL #3   Title  Solid 5/5 right shoulder strength grade to increase stability for functional tasks.    Time  8    Period  Weeks    Status  On-going      PT LONG TERM GOAL #4   Title  Perform all ADL's and recreational activities with right shoulder pain not > 1-2/10.    Time  8    Period  Weeks    Status  Achieved            Plan - 01/16/18 1656    Clinical Impression Statement  Pt continues to improve with ROM and strength. He was able to progress to Green band for therex and ER ROM to 78 degrees today    Clinical Presentation  Stable    Clinical Decision Making  Low    Rehab Potential  Excellent    PT Frequency  2x / week    PT Duration  6 weeks    PT Treatment/Interventions  ADLs/Self Care Home Management;Cryotherapy;Electrical Stimulation;Ultrasound;Moist Heat;Therapeutic activities;Therapeutic exercise;Patient/family education;Manual techniques;Passive range of motion;Dry needling;Vasopneumatic Device    PT Next Visit Plan  Gentle stretching to achieve full right shoulder ER; RW4; [facility] ER; scapular strengthening; postural education; full can; prone rows; prone horizontal abduction.  Modalites PRN.    Consulted and Agree with  Plan of Care  Patient       Patient will benefit from skilled therapeutic intervention in order to improve the following deficits and impairments:  Decreased activity tolerance, Pain, Decreased strength, Decreased range of motion  Visit Diagnosis: Acute pain of left shoulder  Stiffness of left shoulder, not elsewhere classified     Problem List Patient Active Problem List   Diagnosis Date Noted  . Atopic dermatitis 12/05/2016  . ADD (attention deficit disorder) 09/07/2014  . Hypogonadism in male 08/19/2014  . Thyroid disorder 08/19/2014    RAMSEUR,[name], PTA 01/16/2018, 5:54 PM  [facility] [address] [place], [place], [address] Phone: [phone]   Fax:  [phone]  Name: MILUS FRITZE MRN: [id number] Date of Birth: 1971/02/25

## 2018-01-28 ENCOUNTER — Ambulatory Visit: Payer: BLUE CROSS/BLUE SHIELD | Attending: Orthopedic Surgery | Admitting: *Deleted

## 2018-01-28 DIAGNOSIS — M25612 Stiffness of left shoulder, not elsewhere classified: Secondary | ICD-10-CM | POA: Diagnosis not present

## 2018-01-28 DIAGNOSIS — M25512 Pain in left shoulder: Secondary | ICD-10-CM

## 2018-01-28 NOTE — Therapy (Signed)
[facility] [address] [place], [place], [address] Phone: [phone]   Fax:  [phone]  Physical Therapy Treatment  Patient Details  Name: Paul Blackwell MRN: [id number] Date of Birth: 1971/03/31 Referring Provider: Frederico Hamman MD   Encounter Date: 01/28/2018  PT End of Session - 01/28/18 1658    Visit Number  8    Number of Visits  12    Date for PT Re-Evaluation  02/13/18    PT Start Time  1645    PT Stop Time  1734    PT Time Calculation (min)  49 min       No past medical history on file.  Past Surgical History:  Procedure Laterality Date  . CHOLECYSTECTOMY  07/09/2017    There were no vitals filed for this visit.  Subjective Assessment - 01/28/18 1657    Subjective  RT shldr 2/10    Pertinent History  Previous left shoulder injury about 18 years ago.    Patient Stated Goals  Avoid surgery and do all the ctivites I did before surgery.    Currently in Pain?  Yes    Pain Score  2     Pain Location  Shoulder    Pain Orientation  Left    Pain Descriptors / Indicators  Aching;Sore                       [facility] Adult PT Treatment/Exercise - 01/28/18 0001      Exercises   Exercises  Shoulder      Shoulder Exercises: Prone   Other Prone Exercises  Prone Ys 3# until fatigue      Shoulder Exercises: Standing   Row  Left   5# x fatigue   Other Standing Exercises  RW4 with green theraband (ER) green TB (IR, Row, protract) to fatigue.      Shoulder Exercises: ROM/Strengthening   UBE (Upper Arm Bike)  10 minutes at 60 RPM's (4 mins forward and 4 mins backward).      Manual Therapy   Manual Therapy  Passive ROM    Passive ROM  Manual low load long duration stretching of patient's left shoulder into ER in the plane of the scapula while receiving Combo e'stim/U/S at 1.50 W/CM2 x 10 minutes to patient's left anterior shoulder.                  PT Long Term Goals - 01/16/18 1658      PT  LONG TERM GOAL #1   Title  Independent with an advanced HEP.    Time  8    Period  Weeks    Status  Achieved      PT LONG TERM GOAL #3   Title  Solid 5/5 right shoulder strength grade to increase stability for functional tasks.    Time  8    Period  Weeks    Status  On-going      PT LONG TERM GOAL #4   Title  Perform all ADL's and recreational activities with right shoulder pain not > 1-2/10.    Time  8    Period  Weeks    Status  Achieved            Plan - 01/28/18 1820    Clinical Impression Statement  Pt arrived today doing fairly well with mainly soreness in LT shldr. He was able to complete all therex for  for LT shldr F/B [place]  and PROM and continues to progress with ER ROM to 80 degrees    Clinical Presentation  Stable    Clinical Decision Making  Low    Rehab Potential  Excellent    PT Frequency  2x / week    PT Duration  6 weeks    PT Treatment/Interventions  ADLs/Self Care Home Management;Cryotherapy;Electrical Stimulation;Ultrasound;Moist Heat;Therapeutic activities;Therapeutic exercise;Patient/family education;Manual techniques;Passive range of motion;Dry needling;Vasopneumatic Device    PT Next Visit Plan  Gentle stretching to achieve full right shoulder ER; RW4; [facility] ER; scapular strengthening; postural education; full can; prone rows; prone horizontal abduction.  Modalites PRN.    Consulted and Agree with Plan of Care  Patient       Patient will benefit from skilled therapeutic intervention in order to improve the following deficits and impairments:  Decreased activity tolerance, Pain, Decreased strength, Decreased range of motion  Visit Diagnosis: Acute pain of left shoulder  Stiffness of left shoulder, not elsewhere classified     Problem List Patient Active Problem List   Diagnosis Date Noted  . Atopic dermatitis 12/05/2016  . ADD (attention deficit disorder) 09/07/2014  . Hypogonadism in male 08/19/2014  . Thyroid disorder 08/19/2014     [name],[name] , PTA 01/28/2018, 6:25 PM  [facility] [address] [place], [place], [address] Phone: [phone]   Fax:  [phone]  Name: Paul Blackwell MRN: [id number] Date of Birth: December 11, 1970

## 2018-01-30 ENCOUNTER — Ambulatory Visit: Payer: BLUE CROSS/BLUE SHIELD | Admitting: *Deleted

## 2018-01-30 DIAGNOSIS — M25512 Pain in left shoulder: Secondary | ICD-10-CM | POA: Diagnosis not present

## 2018-01-30 DIAGNOSIS — M25612 Stiffness of left shoulder, not elsewhere classified: Secondary | ICD-10-CM

## 2018-01-30 NOTE — Therapy (Signed)
[facility] [address] [place], [place], [address] Phone: [phone]   Fax:  [phone]  Physical Therapy Treatment  Patient Details  Name: Meriam SpragueJoshua J Calkin MRN: [id number] Date of Birth: 12-06-1970 Referring Provider: Frederico Hammananiel Caffrey MD   Encounter Date: 01/30/2018  PT End of Session - 01/30/18 1711    Visit Number  9    Number of Visits  12    Date for PT Re-Evaluation  02/13/18    PT Start Time  1649    PT Stop Time  1740    PT Time Calculation (min)  51 min    Activity Tolerance  Patient tolerated treatment well    Behavior During Therapy  [facility] for tasks assessed/performed       No past medical history on file.  Past Surgical History:  Procedure Laterality Date  . CHOLECYSTECTOMY  07/09/2017    There were no vitals filed for this visit.  Subjective Assessment - 01/30/18 1705    Subjective  Rt shldr 2/10 today. Sore , but ok    Pertinent History  Previous left shoulder injury about 18 years ago.    Patient Stated Goals  Avoid surgery and do all the ctivites I did before surgery.    Currently in Pain?  Yes    Pain Score  2     Pain Orientation  Left    Pain Descriptors / Indicators  Aching;Sore                       [facility] Adult PT Treatment/Exercise - 01/30/18 0001      Exercises   Exercises  Shoulder      Shoulder Exercises: Prone   Other Prone Exercises  Prone Ys 3# until fatigue      Shoulder Exercises: Standing   Row  Left   7# x fatigue   Other Standing Exercises  RW4 with green theraband (ER) green TB (IR, Row, protract) to fatigue.   Try XTS pink     Shoulder Exercises: ROM/Strengthening   UBE (Upper Arm Bike)  10 minutes at 60 RPM's (4 mins forward and 4 mins backward).      Manual Therapy   Manual Therapy  Passive ROM    Passive ROM  Manual low load long duration stretching of patient's left shoulder into ER with shldr at 80 degrees of abduction while receiving Combo e'stim/U/S at  1.50 W/CM2 x 10 minutes to patient's left anterior shoulder.                  PT Long Term Goals - 01/30/18 1712      PT LONG TERM GOAL #1   Title  Independent with an advanced HEP.    Time  8    Period  Weeks    Status  Achieved      PT LONG TERM GOAL #2   Title  Full right shoulder ER.    Time  8    Period  Weeks    Status  On-going      PT LONG TERM GOAL #3   Title  Solid 5/5 right shoulder strength grade to increase stability for functional tasks.    Time  8    Period  Weeks    Status  On-going      PT LONG TERM GOAL #4   Title  Perform all ADL's and recreational activities with right shoulder pain not > 1-2/10.    Time  8  Period  Weeks    Status  Achieved            Plan - 01/30/18 1807    Clinical Impression Statement  Pt arrived today doing fairly well , but reports soreness still in LT shldr. Pt reports increased strength in LT shldr  and was able to perform all therex without increased pain.  Pt was ablee to progress to 7#s with Rows today and will progress to XTS pink band next Rx. 80 degrees on ER today.       Patient will benefit from skilled therapeutic intervention in order to improve the following deficits and impairments:     Visit Diagnosis: Acute pain of left shoulder  Stiffness of left shoulder, not elsewhere classified     Problem List Patient Active Problem List   Diagnosis Date Noted  . Atopic dermatitis 12/05/2016  . ADD (attention deficit disorder) 09/07/2014  . Hypogonadism in male 08/19/2014  . Thyroid disorder 08/19/2014    Argil Mahl,[name], PTA 01/30/2018, 6:15 PM  [facility] [address] [place], [place], [address] Phone: [phone]   Fax:  [phone]  Name: MACAI SISNEROS MRN: [id number] Date of Birth: [birth year]

## 2018-02-04 ENCOUNTER — Encounter: Payer: BLUE CROSS/BLUE SHIELD | Admitting: *Deleted

## 2018-02-06 ENCOUNTER — Ambulatory Visit: Payer: BLUE CROSS/BLUE SHIELD | Admitting: *Deleted

## 2018-02-06 DIAGNOSIS — E291 Testicular hypofunction: Secondary | ICD-10-CM | POA: Diagnosis not present

## 2018-02-06 DIAGNOSIS — M25512 Pain in left shoulder: Secondary | ICD-10-CM

## 2018-02-06 DIAGNOSIS — M25612 Stiffness of left shoulder, not elsewhere classified: Secondary | ICD-10-CM | POA: Diagnosis not present

## 2018-02-06 DIAGNOSIS — Z91018 Allergy to other foods: Secondary | ICD-10-CM | POA: Diagnosis not present

## 2018-02-06 DIAGNOSIS — F909 Attention-deficit hyperactivity disorder, unspecified type: Secondary | ICD-10-CM | POA: Diagnosis not present

## 2018-02-06 DIAGNOSIS — B354 Tinea corporis: Secondary | ICD-10-CM | POA: Diagnosis not present

## 2018-02-06 NOTE — Therapy (Signed)
[facility] [address] [place], [place], [address] Phone: [phone]   Fax:  [phone]  Physical Therapy Treatment  Patient Details  Name: Paul Blackwell MRN: [id number] Date of Birth: 1970/09/24 Referring Provider: Frederico Hamman MD   Encounter Date: 02/06/2018  PT End of Session - 02/06/18 1731    Visit Number  10    Number of Visits  12    Date for PT Re-Evaluation  02/13/18    PT Start Time  1640    PT Stop Time  1730    PT Time Calculation (min)  50 min       No past medical history on file.  Past Surgical History:  Procedure Laterality Date  . CHOLECYSTECTOMY  07/09/2017    There were no vitals filed for this visit.                    [facility] Adult PT Treatment/Exercise - 02/06/18 0001      Exercises   Exercises  Shoulder      Shoulder Exercises: Standing   External Rotation  Strengthening;Left   2x fatigue   External Rotation Weight (lbs)  3    Other Standing Exercises  XTS pink Rows, Protraction, Extension, IR, and ER all 2x fatigue      Shoulder Exercises: ROM/Strengthening   UBE (Upper Arm Bike)  10 minutes at 60 RPM's (4 mins forward and 4 mins backward).      Manual Therapy   Manual Therapy  Passive ROM    Passive ROM  Manual low load long duration stretching of patient's left shoulder into ER with shldr at 80 degrees of abduction while receiving Combo e'stim/U/S at 1.50 W/CM2 x 10 minutes to patient's left anterior shoulder.                  PT Long Term Goals - 01/30/18 1712      PT LONG TERM GOAL #1   Title  Independent with an advanced HEP.    Time  8    Period  Weeks    Status  Achieved      PT LONG TERM GOAL #2   Title  Full right shoulder ER.    Time  8    Period  Weeks    Status  On-going      PT LONG TERM GOAL #3   Title  Solid 5/5 right shoulder strength grade to increase stability for functional tasks.    Time  8    Period  Weeks    Status  On-going       PT LONG TERM GOAL #4   Title  Perform all ADL's and recreational activities with right shoulder pain not > 1-2/10.    Time  8    Period  Weeks    Status  Achieved            Plan - 02/06/18 1737    Clinical Impression Statement  Pt arrived today doing fairly well with LT shldr. He mainly had soreness , but has been doing a lot of work around the house. He was able to progress strengthening today to XTS pink band and perform ER in scaption with 3# wt. ER to 82 degrees today    Clinical Decision Making  Low    Rehab Potential  Excellent    PT Frequency  2x / week    PT Duration  6 weeks    PT Treatment/Interventions  ADLs/Self  Care Home Management;Cryotherapy;Electrical Stimulation;Ultrasound;Moist Heat;Therapeutic activities;Therapeutic exercise;Patient/family education;Manual techniques;Passive range of motion;Dry needling;Vasopneumatic Device    PT Next Visit Plan  Gentle stretching to achieve full right shoulder ER; RW4; [facility]; scapular strengthening; postural education; full can; prone rows; prone horizontal abduction.  Modalites PRN.    Consulted and Agree with Plan of Care  Patient       Patient will benefit from skilled therapeutic intervention in order to improve the following deficits and impairments:  Decreased activity tolerance, Pain, Decreased strength, Decreased range of motion  Visit Diagnosis: Acute pain of left shoulder  Stiffness of left shoulder, not elsewhere classified     Problem List Patient Active Problem List   Diagnosis Date Noted  . Atopic dermatitis 12/05/2016  . ADD (attention deficit disorder) 09/07/2014  . Hypogonadism in male 08/19/2014  . Thyroid disorder 08/19/2014    Arav Bannister,[name], PTA 02/06/2018, 5:54 PM  [facility] [address] [place], [place], [address] Phone: [phone]   Fax:  [phone]  Name: Paul Blackwell MRN: [id number] Date of Birth: 09/06/1970

## 2018-02-18 ENCOUNTER — Ambulatory Visit: Payer: BLUE CROSS/BLUE SHIELD | Attending: Orthopedic Surgery | Admitting: *Deleted

## 2018-02-18 DIAGNOSIS — M25612 Stiffness of left shoulder, not elsewhere classified: Secondary | ICD-10-CM | POA: Insufficient documentation

## 2018-02-18 DIAGNOSIS — M25512 Pain in left shoulder: Secondary | ICD-10-CM | POA: Insufficient documentation

## 2018-02-18 NOTE — Therapy (Signed)
[facility] [address] [place], [place], [address] Phone: [phone]   Fax:  [phone]  Physical Therapy Treatment  Patient Details  Name: Paul Blackwell MRN: [id number] Date of Birth: [birth year] Referring Provider (PT): Frederico Hamman MD   Encounter Date: 02/18/2018  PT End of Session - 02/18/18 1750    Visit Number  11    Number of Visits  12    Date for PT Re-Evaluation  02/13/18    PT Start Time  1645    PT Stop Time  1736    PT Time Calculation (min)  51 min       No past medical history on file.  Past Surgical History:  Procedure Laterality Date  . CHOLECYSTECTOMY  07/09/2017    There were no vitals filed for this visit.  Subjective Assessment - 02/18/18 1658    Subjective  RT shldr doing better 2/10    Pertinent History  Previous left shoulder injury about 18 years ago.    Patient Stated Goals  Avoid surgery and do all the ctivites I did before surgery.    Currently in Pain?  Yes    Pain Score  2     Pain Location  Shoulder    Pain Orientation  Left                       [facility] Adult PT Treatment/Exercise - 02/18/18 0001      Exercises   Exercises  Shoulder      Shoulder Exercises: Standing   External Rotation  Strengthening;Left   2x fatigue   External Rotation Weight (lbs)  3    Other Standing Exercises  XTS pink Rows, Protraction, Extension, IR, and ER all 2x fatigue      Shoulder Exercises: ROM/Strengthening   UBE (Upper Arm Bike)  10 minutes at 60 RPM's (4 mins forward and 4 mins backward).      Manual Therapy   Manual Therapy  Passive ROM    Passive ROM  Manual low load long duration stretching of patient's left shoulder into ER with shldr at 80 degrees of abduction while receiving Combo e'stim/U/S at 1.50 W/CM2 x 10 minutes to patient's left anterior shoulder.                  PT Long Term Goals - 01/30/18 1712      PT LONG TERM GOAL #1   Title  Independent with  an advanced HEP.    Time  8    Period  Weeks    Status  Achieved      PT LONG TERM GOAL #2   Title  Full right shoulder ER.    Time  8    Period  Weeks    Status  On-going      PT LONG TERM GOAL #3   Title  Solid 5/5 right shoulder strength grade to increase stability for functional tasks.    Time  8    Period  Weeks    Status  On-going      PT LONG TERM GOAL #4   Title  Perform all ADL's and recreational activities with right shoulder pain not > 1-2/10.    Time  8    Period  Weeks    Status  Achieved              Patient will benefit from skilled therapeutic intervention in order to improve the following deficits and  impairments:     Visit Diagnosis: Acute pain of left shoulder  Stiffness of left shoulder, not elsewhere classified     Problem List Patient Active Problem List   Diagnosis Date Noted  . Atopic dermatitis 12/05/2016  . ADD (attention deficit disorder) 09/07/2014  . Hypogonadism in male 08/19/2014  . Thyroid disorder 08/19/2014    Tadd Holtmeyer,[name], PTA 02/18/2018, 5:51 PM  [facility] [address] [place], [place], [address] Phone: [phone]   Fax:  [phone]  Name: Paul Blackwell MRN: [id number] Date of Birth: 05/31/1970

## 2018-02-20 ENCOUNTER — Encounter: Payer: Self-pay | Admitting: Physical Therapy

## 2018-02-20 ENCOUNTER — Ambulatory Visit: Payer: BLUE CROSS/BLUE SHIELD | Admitting: Physical Therapy

## 2018-02-20 DIAGNOSIS — M25512 Pain in left shoulder: Secondary | ICD-10-CM | POA: Diagnosis not present

## 2018-02-20 DIAGNOSIS — M25612 Stiffness of left shoulder, not elsewhere classified: Secondary | ICD-10-CM | POA: Diagnosis not present

## 2018-02-20 NOTE — Therapy (Signed)
[facility] [address] [place], [place], [address] Phone: [phone]   Fax:  [phone]  Physical Therapy Treatment  Patient Details  Name: Paul Blackwell MRN: [id number] Date of Birth: 1970-11-10 Referring Provider (PT): Frederico Hamman MD   Encounter Date: 02/18/2018    No past medical history on file.  Past Surgical History:  Procedure Laterality Date  . CHOLECYSTECTOMY  07/09/2017    There were no vitals filed for this visit.                                 PT Long Term Goals - 01/30/18 1712      PT LONG TERM GOAL #1   Title  Independent with an advanced HEP.    Time  8    Period  Weeks    Status  Achieved      PT LONG TERM GOAL #2   Title  Full right shoulder ER.    Time  8    Period  Weeks    Status  On-going      PT LONG TERM GOAL #3   Title  Solid 5/5 right shoulder strength grade to increase stability for functional tasks.    Time  8    Period  Weeks    Status  On-going      PT LONG TERM GOAL #4   Title  Perform all ADL's and recreational activities with right shoulder pain not > 1-2/10.    Time  8    Period  Weeks    Status  Achieved            Plan - 02/20/18 1548    Clinical Impression Statement  Pt arrived today still doing well  with minimal pain in LT shldr. His ROM continues to improve as well.    Clinical Presentation  Stable    Clinical Decision Making  Low    Rehab Potential  Excellent    PT Frequency  2x / week    PT Duration  6 weeks    PT Treatment/Interventions  ADLs/Self Care Home Management;Cryotherapy;Electrical Stimulation;Ultrasound;Moist Heat;Therapeutic activities;Therapeutic exercise;Patient/family education;Manual techniques;Passive range of motion;Dry needling;Vasopneumatic Device    PT Next Visit Plan  DC next visit    Consulted and Agree with Plan of Care  Patient       Patient will benefit from skilled therapeutic intervention in  order to improve the following deficits and impairments:  Decreased activity tolerance, Pain, Decreased strength, Decreased range of motion  Visit Diagnosis: Acute pain of left shoulder  Stiffness of left shoulder, not elsewhere classified     Problem List Patient Active Problem List   Diagnosis Date Noted  . Atopic dermatitis 12/05/2016  . ADD (attention deficit disorder) 09/07/2014  . Hypogonadism in male 08/19/2014  . Thyroid disorder 08/19/2014    RAMSEUR,[name] 02/20/2018, 3:55 PM  [facility] [address] [place], [place], [address] Phone: [phone]   Fax:  [phone]  Name: Paul Blackwell MRN: [id number] Date of Birth: June 01, 1970

## 2018-02-20 NOTE — Therapy (Signed)
[facility] [place], [place], [address] Phone: [phone]   Fax:  [phone]  Physical Therapy Treatment  Patient Details  Name: Paul Blackwell MRN: [id number] Date of Birth: 1971/04/06 Referring Provider (PT): Earlie Server MD   Encounter Date: 02/20/2018  PT End of Session - 02/20/18 1650    Visit Number  12    Number of Visits  12    Date for PT Re-Evaluation  02/13/18    PT Start Time  1694    PT Stop Time  1723    PT Time Calculation (min)  38 min    Activity Tolerance  Patient tolerated treatment well    Behavior During Therapy  [facility] for tasks assessed/performed       History reviewed. No pertinent past medical history.  Past Surgical History:  Procedure Laterality Date  . CHOLECYSTECTOMY  07/09/2017    There were no vitals filed for this visit.  Subjective Assessment - 02/20/18 1649    Subjective  Reports his shoulder is "good" today.    Pertinent History  Previous left shoulder injury about 18 years ago.    Patient Stated Goals  Avoid surgery and do all the ctivites I did before surgery.    Currently in Pain?  No/denies         [facility] PT Assessment - 02/20/18 0001      Assessment   Medical Diagnosis  Left anterior inferior labral tear.    Referring Provider (PT)  Earlie Server MD      Precautions   Precautions  None      Restrictions   Weight Bearing Restrictions  No      ROM / Strength   AROM / PROM / Strength  Strength;AROM      AROM   Overall AROM   Within functional limits for tasks performed    AROM Assessment Site  Shoulder    Right/Left Shoulder  Left    Left Shoulder Internal Rotation  --   T3-4   Left Shoulder External Rotation  85 Degrees      Strength   Overall Strength  Within functional limits for tasks performed    Strength Assessment Site  Shoulder    Right/Left Shoulder  Left    Left Shoulder Flexion  5/5    Left Shoulder ABduction  5/5    Left Shoulder Internal  Rotation  5/5    Left Shoulder External Rotation  5/5                   [facility] Adult PT Treatment/Exercise - 02/20/18 0001      Exercises   Exercises  Shoulder      Shoulder Exercises: Standing   Protraction  Strengthening;Left;Limitations    Protraction Limitations  Pink XTS 3x10 reps    External Rotation  Strengthening;Left;Limitations    External Rotation Limitations  Pink XTS 3x10 reps    Internal Rotation  Strengthening;Left;Limitations    Internal Rotation Limitations  Pink XTS 3x10 reps    Row  Strengthening;Left;Limitations    Row Limitations  Pink XTS 3x10 reps    Other Standing Exercises  90/90 L shoulder ER 3# x30 reps      Shoulder Exercises: ROM/Strengthening   UBE (Upper Arm Bike)  60 RPM x10 min      Modalities   Modalities  Ultrasound      Ultrasound   Ultrasound Location  L anterior shoulder    Ultrasound Parameters  Combo 1.5  w/cm2, 100%, 1 mhz x10 min    Ultrasound Goals  Pain      Manual Therapy   Manual Therapy  Passive ROM    Passive ROM  LLDS in ER with L shoulder combo [place]                  PT Long Term Goals - 02/20/18 1701      PT LONG TERM GOAL #1   Title  Independent with an advanced HEP.    Time  8    Period  Weeks    Status  Achieved      PT LONG TERM GOAL #2   Title  Full left shoulder ER.    Time  8    Period  Weeks    Status  Not Met      PT LONG TERM GOAL #3   Title  Solid 5/5 left shoulder strength grade to increase stability for functional tasks.    Time  8    Period  Weeks    Status  Achieved      PT LONG TERM GOAL #4   Title  Perform all ADL's and recreational activities with left shoulder pain not > 1-2/10.    Time  8    Period  Weeks    Status  Achieved            Plan - 02/20/18 1729    Clinical Impression Statement  Patient arrived today with no L shoulder pain. Patient guided through all strengthening exercises with no complaints of any increased L shoulder pain. Patient's L shoulder  strength is functional and able to achieve goal with 5/5 throughout L shoulder. L shoulder ER functional but not able to achieve full goal. Normal [place] response with combo and LLDS into ER. No complaints from patient following end of treatment. 60% overall improvement since beginning PT according to PT.    Rehab Potential  Excellent    PT Frequency  2x / week    PT Duration  6 weeks    PT Treatment/Interventions  ADLs/Self Care Home Management;Cryotherapy;Electrical Stimulation;Ultrasound;Moist Heat;Therapeutic activities;Therapeutic exercise;Patient/family education;Manual techniques;Passive range of motion;Dry needling;Vasopneumatic Device    PT Next Visit Plan  D/C summary required.    Consulted and Agree with Plan of Care  Patient       Patient will benefit from skilled therapeutic intervention in order to improve the following deficits and impairments:  Decreased activity tolerance, Pain, Decreased strength, Decreased range of motion  Visit Diagnosis: Acute pain of left shoulder  Stiffness of left shoulder, not elsewhere classified     Problem List Patient Active Problem List   Diagnosis Date Noted  . Atopic dermatitis 12/05/2016  . ADD (attention deficit disorder) 09/07/2014  . Hypogonadism in male 08/19/2014  . Thyroid disorder 08/19/2014    Standley Brooking, PTA 02/20/18 5:34 PM   [facility] [address] [place], [place], [address] Phone: [phone]   Fax:  [phone]  Name: Paul Blackwell MRN: [id number] Date of Birth: 07-Aug-1970  PHYSICAL THERAPY DISCHARGE SUMMARY  Visits from Start of Care: 12.  Current functional level related to goals / functional outcomes: See above.   Remaining deficits: LTG #2 unmet (ER=80 degrees).   Education / Equipment: HEP.  Plan: Patient agrees to discharge.  Patient goals were partially met. Patient is being discharged due to being pleased with the current functional level.   ?????         Mali  Applegate MPT

## 2018-03-07 ENCOUNTER — Ambulatory Visit: Payer: BLUE CROSS/BLUE SHIELD | Admitting: Physician Assistant

## 2018-07-28 DIAGNOSIS — E291 Testicular hypofunction: Secondary | ICD-10-CM | POA: Diagnosis not present

## 2018-07-28 DIAGNOSIS — Z79890 Hormone replacement therapy (postmenopausal): Secondary | ICD-10-CM | POA: Diagnosis not present

## 2018-07-28 DIAGNOSIS — E039 Hypothyroidism, unspecified: Secondary | ICD-10-CM | POA: Diagnosis not present

## 2018-07-28 DIAGNOSIS — E063 Autoimmune thyroiditis: Secondary | ICD-10-CM | POA: Diagnosis not present

## 2018-07-28 DIAGNOSIS — E672 Megavitamin-B6 syndrome: Secondary | ICD-10-CM | POA: Diagnosis not present

## 2019-04-22 IMAGING — US US ABDOMEN LIMITED
1 series · 14 of 25 positions shown · non-contrast
Comparison: CT abdomen pelvis 05/13/2017

CLINICAL DATA: Abdominal pain

EXAM:
ULTRASOUND ABDOMEN LIMITED RIGHT UPPER QUADRANT

[Series 1: us abdomen limited · 0.23mm/px · 14 of 55 slices shown]
[im 1/55]
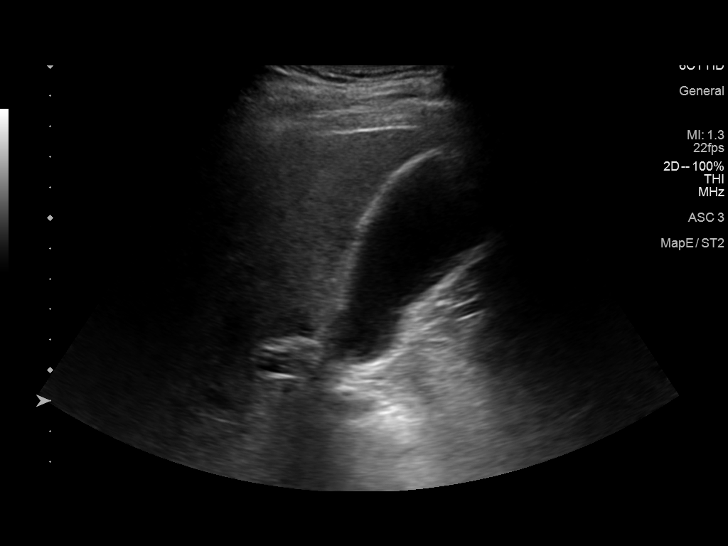
[im 5/55]
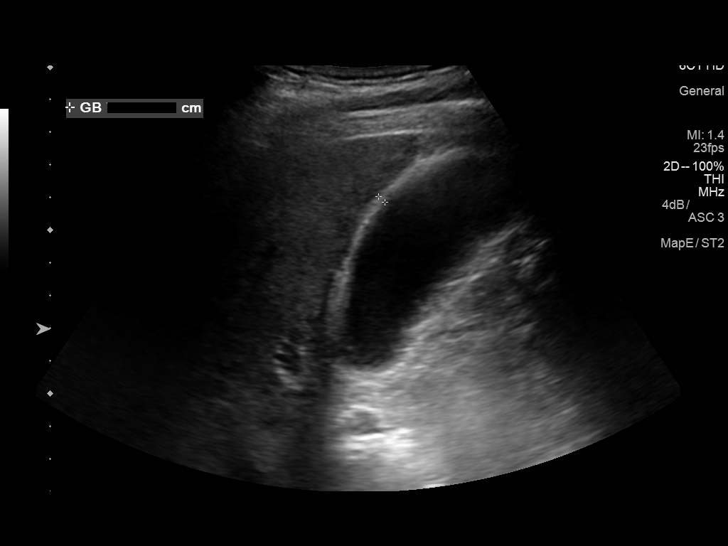
[im 10/55]
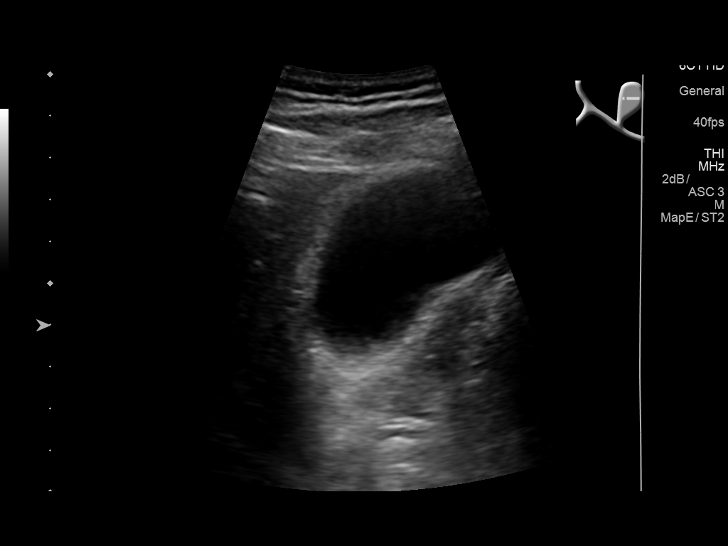
[im 14/55]
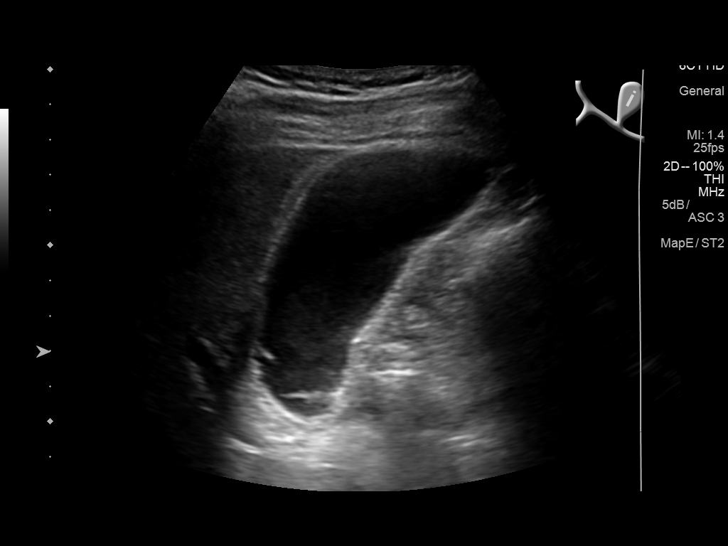
[im 19/55]
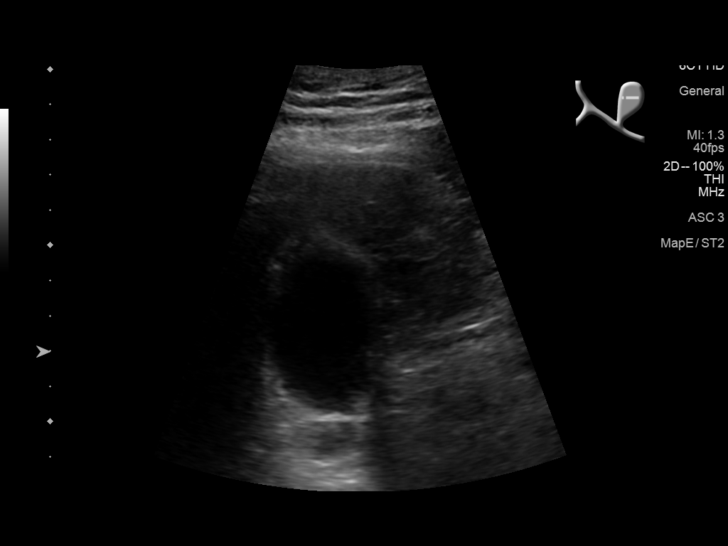
[im 21/55]
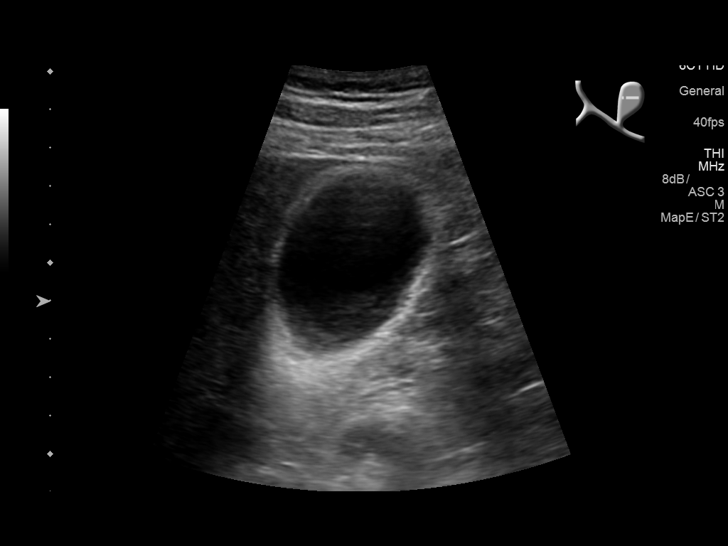
[im 25/55]
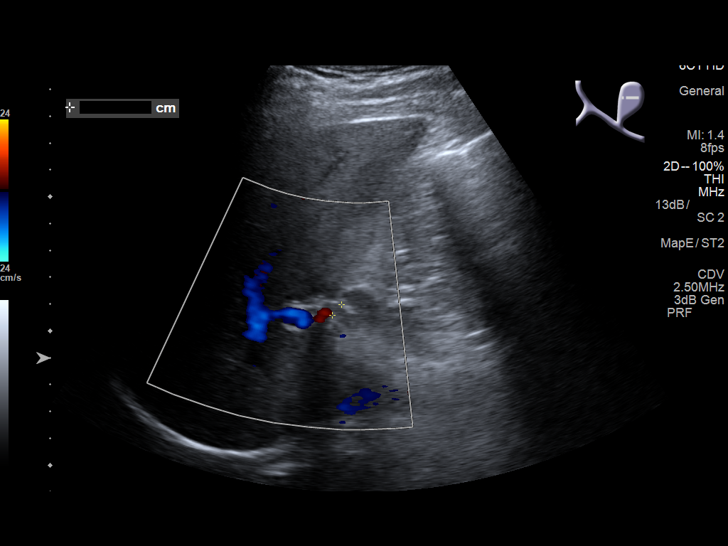
[im 30/55]
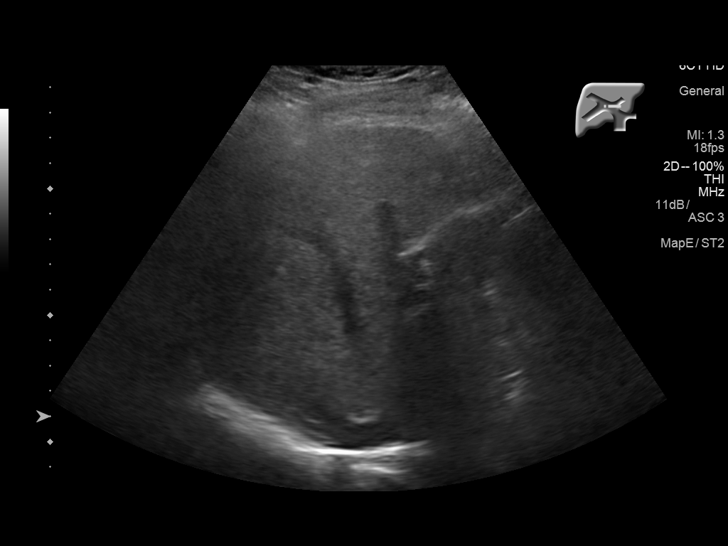
[im 34/55]
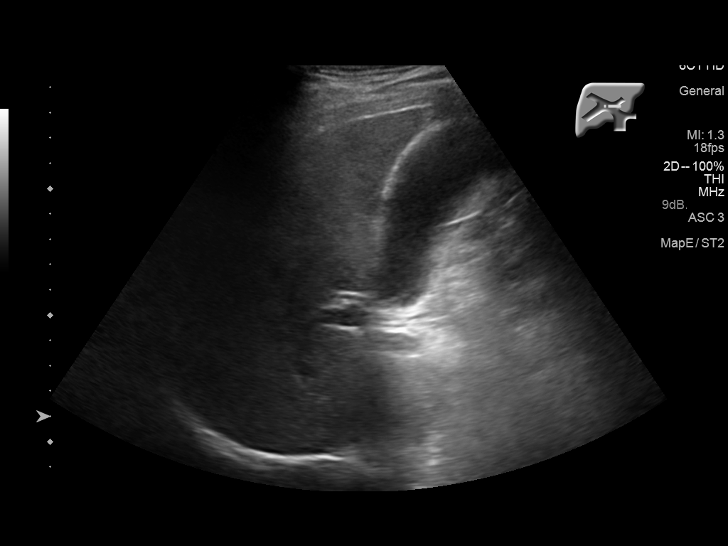
[im 37/55]
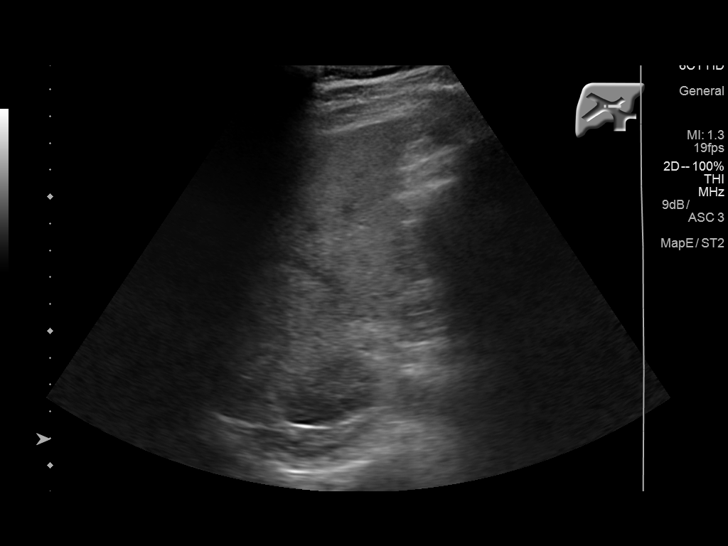
[im 41/55]
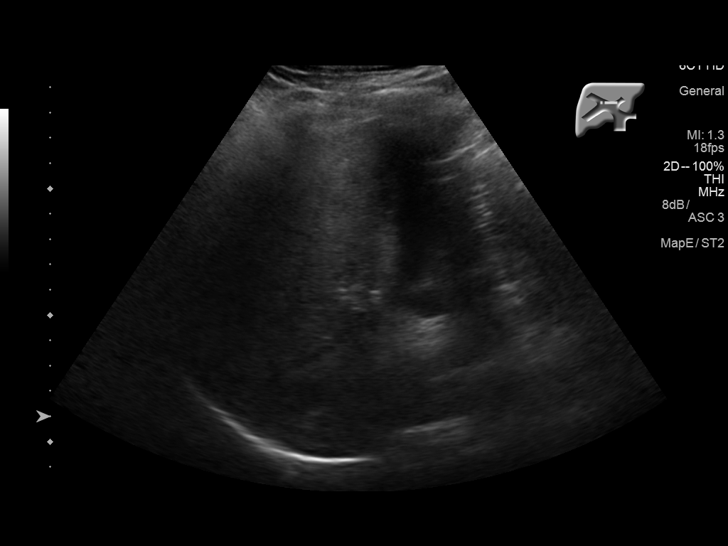
[im 46/55]
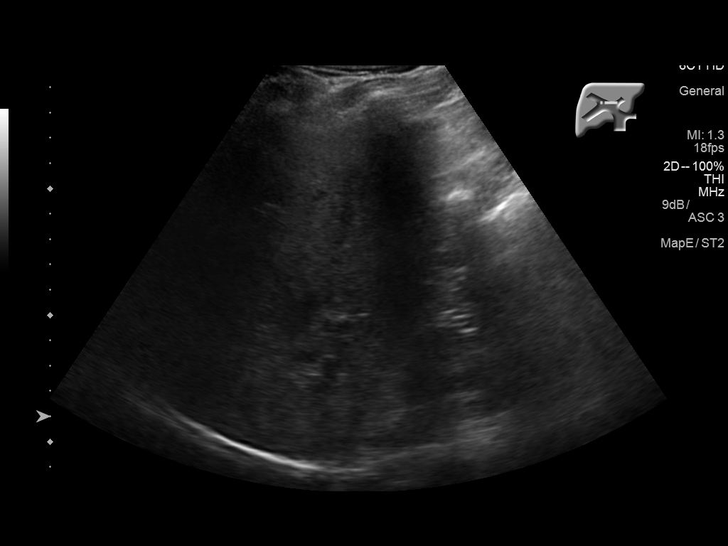
[im 50/55]
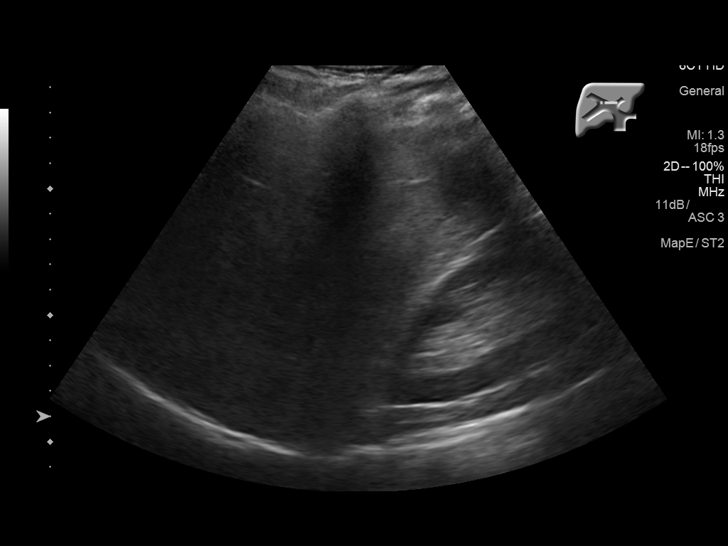
[im 55/55]
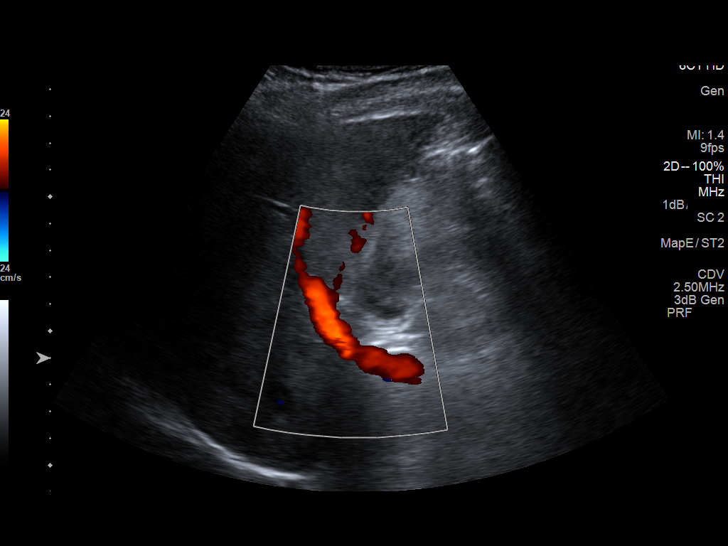

[14 of 25 positions shown; findings below may reference images not displayed]

FINDINGS: Gallbladder:

There is diffuse mild gallbladder wall thickening. No positive
sonographic Murphy sign was demonstrated by the sonographer. No
cholelithiasis is seen. The stone described on the earlier CT is not
visible.

Common bile duct:

Diameter: 5.5 mm

Liver:

No focal lesion identified. Within normal limits in parenchymal
echogenicity. Portal vein is patent on color Doppler imaging with
normal direction of blood flow towards the liver.
IMPRESSION: 1. Mild gallbladder wall thickening and diffusely but no visible
cholelithiasis or positive sonographic Murphy sign. The small stones
seen on CT is not visualized.
2. No biliary dilatation.

## 2019-07-20 IMAGING — NM NM HEPATO W/GB/PHARM/[PERSON_NAME]
2 series · 12 of 12 positions shown · non-contrast
Comparison: CT ultrasound 05/13/2017

CLINICAL DATA: RIGHT upper quadrant pain for 8 hours

EXAM:
NUCLEAR MEDICINE HEPATOBILIARY IMAGING WITH GALLBLADDER EF
TECHNIQUE: Sequential images of the abdomen were obtained [DATE] minutes
following intravenous administration of radiopharmaceutical. After
oral ingestion of Ensure, gallbladder ejection fraction was
determined. At 60 min, normal ejection fraction is greater than 33%.
RADIOPHARMACEUTICALS:  4.8 mCi Gc-OOm  Choletec IV

[Series 1: biliary · 3.25mm/px · 6 of 60 frames shown]
[frame 6/60]
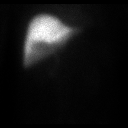
[frame 16/60]
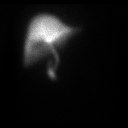
[frame 26/60]
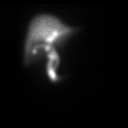
[frame 36/60]
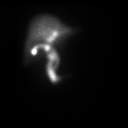
[frame 46/60]
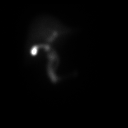
[frame 56/60]
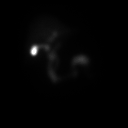

[Series 2: gbef · 3.25mm/px · 6 of 60 frames shown]
[frame 6/60]
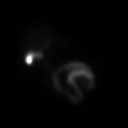
[frame 16/60]
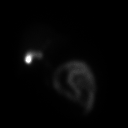
[frame 26/60]
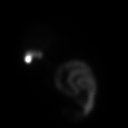
[frame 36/60]
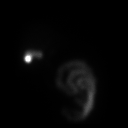
[frame 46/60]
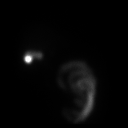
[frame 56/60]
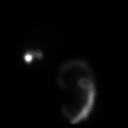

[12 of 12 positions shown; findings below may reference images not displayed]

FINDINGS: There is prompt clearance of radiotracer from the blood pool and
homogeneous uptake within the liver. Counts are evident within the
gallbladder by 30 min. Counts are present within the small bowel by
20 min. Administration of a fatty meal resulted in minimal
contraction of the gallbladder with calculate ejection fracture
equals 13 %.
IMPRESSION: Low gallbladder ejection fraction = 13%. Common differential
diagnosis includes chronic cholecystitis, biliary dyskinesia, and
sphincter Oddi dysfunction.

Patent cystic duct and common bile duct.

## 2019-11-30 DIAGNOSIS — M7712 Lateral epicondylitis, left elbow: Secondary | ICD-10-CM | POA: Diagnosis not present

## 2019-11-30 DIAGNOSIS — M7711 Lateral epicondylitis, right elbow: Secondary | ICD-10-CM | POA: Diagnosis not present

## 2019-11-30 DIAGNOSIS — M25531 Pain in right wrist: Secondary | ICD-10-CM | POA: Diagnosis not present

## 2019-12-03 DIAGNOSIS — M7711 Lateral epicondylitis, right elbow: Secondary | ICD-10-CM | POA: Diagnosis not present

## 2019-12-03 DIAGNOSIS — M7712 Lateral epicondylitis, left elbow: Secondary | ICD-10-CM | POA: Diagnosis not present

## 2019-12-08 DIAGNOSIS — M7712 Lateral epicondylitis, left elbow: Secondary | ICD-10-CM | POA: Diagnosis not present

## 2019-12-08 DIAGNOSIS — M7711 Lateral epicondylitis, right elbow: Secondary | ICD-10-CM | POA: Diagnosis not present

## 2019-12-10 DIAGNOSIS — M7712 Lateral epicondylitis, left elbow: Secondary | ICD-10-CM | POA: Diagnosis not present

## 2019-12-10 DIAGNOSIS — M7711 Lateral epicondylitis, right elbow: Secondary | ICD-10-CM | POA: Diagnosis not present

## 2019-12-17 DIAGNOSIS — M7711 Lateral epicondylitis, right elbow: Secondary | ICD-10-CM | POA: Diagnosis not present

## 2019-12-17 DIAGNOSIS — M7712 Lateral epicondylitis, left elbow: Secondary | ICD-10-CM | POA: Diagnosis not present

## 2019-12-28 DIAGNOSIS — M7712 Lateral epicondylitis, left elbow: Secondary | ICD-10-CM | POA: Diagnosis not present

## 2019-12-28 DIAGNOSIS — M7711 Lateral epicondylitis, right elbow: Secondary | ICD-10-CM | POA: Diagnosis not present

## 2019-12-31 DIAGNOSIS — M7712 Lateral epicondylitis, left elbow: Secondary | ICD-10-CM | POA: Diagnosis not present

## 2019-12-31 DIAGNOSIS — M7711 Lateral epicondylitis, right elbow: Secondary | ICD-10-CM | POA: Diagnosis not present

## 2020-01-07 DIAGNOSIS — M7711 Lateral epicondylitis, right elbow: Secondary | ICD-10-CM | POA: Diagnosis not present

## 2020-01-07 DIAGNOSIS — M7712 Lateral epicondylitis, left elbow: Secondary | ICD-10-CM | POA: Diagnosis not present

## 2020-01-11 DIAGNOSIS — M7711 Lateral epicondylitis, right elbow: Secondary | ICD-10-CM | POA: Diagnosis not present

## 2020-01-11 DIAGNOSIS — M7712 Lateral epicondylitis, left elbow: Secondary | ICD-10-CM | POA: Diagnosis not present

## 2020-01-18 DIAGNOSIS — M7711 Lateral epicondylitis, right elbow: Secondary | ICD-10-CM | POA: Diagnosis not present

## 2020-01-18 DIAGNOSIS — M7712 Lateral epicondylitis, left elbow: Secondary | ICD-10-CM | POA: Diagnosis not present

## 2020-01-26 DIAGNOSIS — M7712 Lateral epicondylitis, left elbow: Secondary | ICD-10-CM | POA: Diagnosis not present

## 2020-01-26 DIAGNOSIS — M7711 Lateral epicondylitis, right elbow: Secondary | ICD-10-CM | POA: Diagnosis not present

## 2020-02-04 DIAGNOSIS — M7712 Lateral epicondylitis, left elbow: Secondary | ICD-10-CM | POA: Diagnosis not present

## 2020-02-04 DIAGNOSIS — M7711 Lateral epicondylitis, right elbow: Secondary | ICD-10-CM | POA: Diagnosis not present

## 2020-02-08 DIAGNOSIS — M7711 Lateral epicondylitis, right elbow: Secondary | ICD-10-CM | POA: Diagnosis not present

## 2020-02-08 DIAGNOSIS — M7712 Lateral epicondylitis, left elbow: Secondary | ICD-10-CM | POA: Diagnosis not present

## 2020-02-15 DIAGNOSIS — M7712 Lateral epicondylitis, left elbow: Secondary | ICD-10-CM | POA: Diagnosis not present

## 2020-02-15 DIAGNOSIS — M7711 Lateral epicondylitis, right elbow: Secondary | ICD-10-CM | POA: Diagnosis not present

## 2020-03-07 DIAGNOSIS — M7712 Lateral epicondylitis, left elbow: Secondary | ICD-10-CM | POA: Diagnosis not present

## 2020-03-07 DIAGNOSIS — M7711 Lateral epicondylitis, right elbow: Secondary | ICD-10-CM | POA: Diagnosis not present

## 2020-03-28 DIAGNOSIS — M7712 Lateral epicondylitis, left elbow: Secondary | ICD-10-CM | POA: Diagnosis not present

## 2020-03-28 DIAGNOSIS — M7711 Lateral epicondylitis, right elbow: Secondary | ICD-10-CM | POA: Diagnosis not present

## 2022-09-24 ENCOUNTER — Other Ambulatory Visit (HOSPITAL_COMMUNITY): Payer: Self-pay | Admitting: Family Medicine

## 2022-09-24 DIAGNOSIS — E785 Hyperlipidemia, unspecified: Secondary | ICD-10-CM

## 2023-07-02 ENCOUNTER — Other Ambulatory Visit: Payer: Self-pay | Admitting: Urology

## 2023-07-02 DIAGNOSIS — R972 Elevated prostate specific antigen [PSA]: Secondary | ICD-10-CM

## 2023-08-14 ENCOUNTER — Ambulatory Visit
Admission: RE | Admit: 2023-08-14 | Discharge: 2023-08-14 | Disposition: A | Discharge status: Home / Self Care | Payer: BLUE CROSS/BLUE SHIELD | Source: Ambulatory Visit | Attending: Urology | Admitting: Urology

## 2023-08-14 DIAGNOSIS — R972 Elevated prostate specific antigen [PSA]: Secondary | ICD-10-CM

## 2023-08-14 MED ORDER — GADOPICLENOL 0.5 MMOL/ML IV SOLN
9.0000 mL | Freq: Once | INTRAVENOUS | Status: AC | PRN
  Administered 2023-08-14: 9 mL via INTRAVENOUS

## 2024-03-09 ENCOUNTER — Other Ambulatory Visit (HOSPITAL_BASED_OUTPATIENT_CLINIC_OR_DEPARTMENT_OTHER): Payer: Self-pay | Admitting: Family Medicine

## 2024-03-09 DIAGNOSIS — E785 Hyperlipidemia, unspecified: Secondary | ICD-10-CM

## 2024-03-30 ENCOUNTER — Ambulatory Visit (HOSPITAL_BASED_OUTPATIENT_CLINIC_OR_DEPARTMENT_OTHER)
Admission: RE | Admit: 2024-03-30 | Discharge: 2024-03-30 | Disposition: A | Discharge status: Home / Self Care | Payer: Self-pay | Source: Ambulatory Visit | Attending: Family Medicine | Admitting: Family Medicine

## 2024-03-30 DIAGNOSIS — E785 Hyperlipidemia, unspecified: Secondary | ICD-10-CM | POA: Insufficient documentation
